# Patient Record
Sex: Female | Born: 1990 | Race: White | Hispanic: Yes | Marital: Married | Smoking: Never smoker
Health system: Southern US, Community
[De-identification: ages and names within clinical notes are randomized; demographics above are authoritative.]

## PROBLEM LIST (undated history)

## (undated) ENCOUNTER — Inpatient Hospital Stay (HOSPITAL_COMMUNITY): Payer: Self-pay

## (undated) DIAGNOSIS — R000 Tachycardia, unspecified: Secondary | ICD-10-CM

## (undated) DIAGNOSIS — K219 Gastro-esophageal reflux disease without esophagitis: Secondary | ICD-10-CM

## (undated) DIAGNOSIS — E221 Hyperprolactinemia: Secondary | ICD-10-CM

## (undated) DIAGNOSIS — D649 Anemia, unspecified: Secondary | ICD-10-CM

## (undated) DIAGNOSIS — R002 Palpitations: Secondary | ICD-10-CM

## (undated) HISTORY — DX: Palpitations: R00.2

## (undated) HISTORY — DX: Hyperprolactinemia: E22.1

## (undated) HISTORY — PX: NO PAST SURGERIES: SHX2092

## (undated) HISTORY — DX: Tachycardia, unspecified: R00.0

## (undated) HISTORY — DX: Gastro-esophageal reflux disease without esophagitis: K21.9

---
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
## 2010-02-20 ENCOUNTER — Observation Stay (HOSPITAL_COMMUNITY)
Admission: EM | Admit: 2010-02-20 | Discharge: 2010-02-20 | Payer: Self-pay | Source: Home / Self Care | Attending: Emergency Medicine | Admitting: Emergency Medicine

## 2010-02-22 ENCOUNTER — Emergency Department (HOSPITAL_COMMUNITY)
Admission: EM | Admit: 2010-02-22 | Discharge: 2010-02-22 | Payer: Self-pay | Source: Home / Self Care | Admitting: Family Medicine

## 2010-05-03 ENCOUNTER — Encounter: Payer: Self-pay | Admitting: Internal Medicine

## 2010-05-03 ENCOUNTER — Ambulatory Visit (INDEPENDENT_AMBULATORY_CARE_PROVIDER_SITE_OTHER): Payer: Managed Care, Other (non HMO) | Admitting: Internal Medicine

## 2010-05-03 DIAGNOSIS — J039 Acute tonsillitis, unspecified: Secondary | ICD-10-CM | POA: Insufficient documentation

## 2010-05-03 DIAGNOSIS — T7840XA Allergy, unspecified, initial encounter: Secondary | ICD-10-CM

## 2010-05-04 ENCOUNTER — Encounter: Payer: Self-pay | Admitting: Internal Medicine

## 2010-05-04 ENCOUNTER — Telehealth: Payer: Self-pay | Admitting: Internal Medicine

## 2010-05-10 NOTE — Letter (Signed)
Summary: Out of School  [facility]  [address] [place], [place] [address]   Phone: [phone]  Fax: [phone]    May 04, 2010   Student:  Lorin Picket    To Whom It May Concern:   For Medical reasons, please excuse the above named student from school for the following dates:  Start:   05/03/10 End:    05/04/10  The patient may return on 05/05/10 If you need additional information, please feel free to contact our office.   Sincerely,    Willow Ora, MD    ****This is a legal document and cannot be tampered with.  Schools are authorized to verify all information and to do so accordingly.

## 2010-05-10 NOTE — Assessment & Plan Note (Signed)
Summary: NEW TO EST,AETNA,NS FEE/RH.....   Vital Signs:  Patient profile:   20 year old female Height:      61.5 inches Weight:      104 pounds BMI:     19.40 Pulse rate:   71 / minute Pulse rhythm:   regular BP sitting:   116 / 64  (left arm) Cuff size:   regular  Vitals Entered By: Army Fossa CMA (May 03, 2010 2:02 PM) CC: Pt here to new est-fasting  Comments wants referrals to Gyn and Allergist   History of Present Illness: new patient, here w/ mom  needs gyn referal  ?allergist referal... concerned b/c was dx w/ allergy to vancomycin and doxy, wonders what else she is allergic to  on-off white patches @ tonsils that she can remove w/ her finger or "cough up" symptoms not necesarily associated to ST-F  ROS feels well today, no fever, no ST denies itchy eyes or nose no PN drip no cough or chest congestion       Preventive Screening-Counseling & Management  Alcohol-Tobacco     Smoking Status: never  Caffeine-Diet-Exercise     Does Patient Exercise: yes  Allergies (verified): 1)  ! Vancomycin 2)  ! Doxycycline Hyclate (Doxycycline Hyclate)  Past History:  Family History: Last updated: 05/03/2010 colon ca--no Breast cancer-- multiple from father's side  Diabetes-- GP  Hypertension-- GF   Social History: Last updated: 05/03/2010 Single Never Smoked Alcohol use-no Regular exercise-yes lives w/ parents   Risk Factors: Exercise: yes (05/03/2010)  Risk Factors: Smoking Status: never (05/03/2010)  Past Surgical History: no major   Family History: colon ca--no Breast cancer-- multiple from father's side  Diabetes-- GP  Hypertension-- GF   Social History: Single Never Smoked Alcohol use-no Regular exercise-yes lives w/ parents   Smoking Status:  never Does Patient Exercise:  yes  Physical Exam  General:  alert and well-developed.  NAD Ears:  R ear normal and L ear normal.   Nose:  no nasal discharge.   Mouth:  throat  w/o redness , no d/c, tonsils symetric-small Lungs:  normal respiratory effort, no intercostal retractions, no accessory muscle use, and normal breath sounds.   Heart:  normal rate, regular rhythm, and no murmur.     Impression & Recommendations:  Problem # 1:  ALLERGIC REACTION (ICD-995.3)  allegic rx: strongly recommend to memorize the name of the 2 abx she is allergic to i don't feel a formal allergist referal is indicated but if she is worried , she will call and I'll refer her  addendum----likes to see the specilist, referal done call  patient at [phone]  Orders: Allergy Referral  (Allergy)  Problem # 2:  ALLERGIC REACTION (ICD-995.3)  on -off  tonsilitis? checka Cx, treat if appropiate call if F-ST  Orders: Allergy Referral  (Allergy)  Problem # 3:  ROUTINE GENERAL MEDICAL EXAM@HEALTH  CARE FACL (ICD-V70.0)  refr to gyn, needs a femae practitioner   Orders: Gynecologic Referral (Gyn)  Other Orders: T-Culture, Throat ([id number])   Orders Added: 1)  T-Culture, Throat [[id number]] 2)  Gynecologic Referral [Gyn] 3)  Allergy Referral  [Allergy] 4)  New Patient Level II [57846]

## 2010-05-10 NOTE — Progress Notes (Signed)
Summary: Note for school  Phone Note Call from Patient Call back at Home Phone [phone]   Summary of Call: Patient left message on triage that she forgot to ask for note for school. She notes that she cannot pick it up today, but can do so tomorrow. Please advise. Lucious Groves CMA  May 04, 2010 11:56 AM   Follow-up for Phone Call        ok to do for yesterday and today if needed  Follow-up by: [facility] E. Paz MD,  May 04, 2010 12:49 PM  Additional Follow-up for Phone Call Additional follow up Details #1::        Left message on voicemail that note is ready for pick up.  Additional Follow-up by: Lucious Groves CMA,  May 04, 2010 2:19 PM

## 2010-05-24 LAB — POCT I-STAT, CHEM 8
BUN: 15 mg/dL (ref 6–23)
Chloride: 106 mEq/L (ref 96–112)
Creatinine, Ser: 0.7 mg/dL (ref 0.4–1.2)
Potassium: 3.8 mEq/L (ref 3.5–5.1)
Sodium: 141 mEq/L (ref 135–145)
TCO2: 26 mmol/L (ref 0–100)

## 2010-05-24 LAB — DIFFERENTIAL
Eosinophils Relative: 4 % (ref 0–5)
Lymphocytes Relative: 32 % (ref 12–46)
Lymphs Abs: 1.8 10*3/uL (ref 0.7–4.0)
Neutro Abs: 2.9 10*3/uL (ref 1.7–7.7)

## 2010-05-24 LAB — CBC
MCV: 89.1 fL (ref 78.0–100.0)
Platelets: 242 10*3/uL (ref 150–400)
RBC: 4.3 MIL/uL (ref 3.87–5.11)
WBC: 5.7 10*3/uL (ref 4.0–10.5)

## 2010-06-15 LAB — DIFFERENTIAL
Basophils Absolute: 0 10*3/uL (ref 0.0–0.1)
Basophils Relative: 1 % (ref 0–1)
Eosinophils Absolute: 0.2 10*3/uL (ref 0.0–1.2)
Eosinophils Relative: 3 % (ref 0–5)
Lymphocytes Relative: 38 % (ref 24–48)
Lymphs Abs: 2.4 10*3/uL (ref 1.1–4.8)
Monocytes Absolute: 0.6 10*3/uL (ref 0.2–1.2)
Monocytes Relative: 9 % (ref 3–11)
Neutro Abs: 3.2 10*3/uL (ref 1.7–8.0)
Neutrophils Relative %: 50 % (ref 43–71)

## 2010-06-15 LAB — POCT I-STAT, CHEM 8
BUN: 7 mg/dL (ref 6–23)
Calcium, Ion: 1.19 mmol/L (ref 1.12–1.32)
Chloride: 105 mEq/L (ref 96–112)
Creatinine, Ser: 0.8 mg/dL (ref 0.4–1.2)
Glucose, Bld: 88 mg/dL (ref 70–99)
HCT: 42 % (ref 36.0–49.0)
Hemoglobin: 14.3 g/dL (ref 12.0–16.0)
Potassium: 3.9 mEq/L (ref 3.5–5.1)
Sodium: 138 mEq/L (ref 135–145)
TCO2: 23 mmol/L (ref 0–100)

## 2010-06-15 LAB — POCT URINALYSIS DIP (DEVICE)
Bilirubin Urine: NEGATIVE
Glucose, UA: NEGATIVE mg/dL
Hgb urine dipstick: NEGATIVE
Ketones, ur: NEGATIVE mg/dL
Nitrite: NEGATIVE
Protein, ur: NEGATIVE mg/dL
Specific Gravity, Urine: 1.02 (ref 1.005–1.030)
Urobilinogen, UA: 0.2 mg/dL (ref 0.0–1.0)
pH: 6 (ref 5.0–8.0)

## 2010-06-15 LAB — CBC
HCT: 39.3 % (ref 36.0–49.0)
Hemoglobin: 13.4 g/dL (ref 12.0–16.0)
MCHC: 34.1 g/dL (ref 31.0–37.0)
MCV: 87.8 fL (ref 78.0–98.0)
Platelets: 240 10*3/uL (ref 150–400)
RBC: 4.48 MIL/uL (ref 3.80–5.70)
RDW: 13.5 % (ref 11.4–15.5)
WBC: 6.4 10*3/uL (ref 4.5–13.5)

## 2010-06-15 LAB — POCT H PYLORI SCREEN: H. PYLORI SCREEN, POC: POSITIVE — AB

## 2010-10-11 ENCOUNTER — Emergency Department (HOSPITAL_COMMUNITY)
Admission: EM | Admit: 2010-10-11 | Discharge: 2010-10-11 | Disposition: A | Discharge status: Home / Self Care | Payer: Managed Care, Other (non HMO) | Attending: Emergency Medicine | Admitting: Emergency Medicine

## 2010-10-11 DIAGNOSIS — I10 Essential (primary) hypertension: Secondary | ICD-10-CM | POA: Insufficient documentation

## 2010-10-11 DIAGNOSIS — F410 Panic disorder [episodic paroxysmal anxiety] without agoraphobia: Secondary | ICD-10-CM | POA: Insufficient documentation

## 2010-10-11 DIAGNOSIS — T7840XA Allergy, unspecified, initial encounter: Secondary | ICD-10-CM | POA: Insufficient documentation

## 2010-12-08 LAB — POCT PREGNANCY, URINE: Operator id: 26329

## 2010-12-08 LAB — DIFFERENTIAL
Basophils Absolute: 0
Basophils Relative: 1
Monocytes Absolute: 0.7
Neutro Abs: 6
Neutrophils Relative %: 68

## 2010-12-08 LAB — COMPREHENSIVE METABOLIC PANEL
AST: 21
Albumin: 4.1
Alkaline Phosphatase: 66
CO2: 21
Calcium: 9
Creatinine, Ser: 0.71
Sodium: 134 — ABNORMAL LOW
Total Bilirubin: 0.6
Total Protein: 7.2

## 2010-12-08 LAB — CBC
HCT: 35.4 — ABNORMAL LOW
Hemoglobin: 12.3
MCHC: 34.9
MCHC: 34
MCV: 88.3
MCV: 87.3
Platelets: 221
Platelets: 237
RBC: 4.33
RDW: 14.7
RDW: 14.4

## 2010-12-08 LAB — URINALYSIS, ROUTINE W REFLEX MICROSCOPIC
Glucose, UA: NEGATIVE
Hgb urine dipstick: NEGATIVE
Ketones, ur: NEGATIVE
Nitrite: NEGATIVE
Protein, ur: NEGATIVE
Protein, ur: NEGATIVE
Urobilinogen, UA: 1
pH: 6

## 2010-12-08 LAB — GC/CHLAMYDIA PROBE AMP, GENITAL: Chlamydia, DNA Probe: NEGATIVE

## 2010-12-08 LAB — URINE CULTURE
Colony Count: NO GROWTH
Culture: NO GROWTH

## 2010-12-08 LAB — URINE MICROSCOPIC-ADD ON

## 2010-12-08 LAB — WET PREP, GENITAL
Clue Cells Wet Prep HPF POC: NONE SEEN
Trich, Wet Prep: NONE SEEN
Yeast Wet Prep HPF POC: NONE SEEN

## 2011-02-07 ENCOUNTER — Emergency Department (HOSPITAL_COMMUNITY)
Admission: EM | Admit: 2011-02-07 | Discharge: 2011-02-08 | Disposition: A | Discharge status: Home / Self Care | Payer: Self-pay | Attending: Emergency Medicine | Admitting: Emergency Medicine

## 2011-02-07 ENCOUNTER — Encounter: Payer: Self-pay | Admitting: *Deleted

## 2011-02-07 ENCOUNTER — Emergency Department (HOSPITAL_COMMUNITY): Payer: Self-pay

## 2011-02-07 DIAGNOSIS — R059 Cough, unspecified: Secondary | ICD-10-CM | POA: Insufficient documentation

## 2011-02-07 DIAGNOSIS — R5383 Other fatigue: Secondary | ICD-10-CM | POA: Insufficient documentation

## 2011-02-07 DIAGNOSIS — J111 Influenza due to unidentified influenza virus with other respiratory manifestations: Secondary | ICD-10-CM | POA: Insufficient documentation

## 2011-02-07 DIAGNOSIS — R5381 Other malaise: Secondary | ICD-10-CM | POA: Insufficient documentation

## 2011-02-07 DIAGNOSIS — R509 Fever, unspecified: Secondary | ICD-10-CM | POA: Insufficient documentation

## 2011-02-07 DIAGNOSIS — R51 Headache: Secondary | ICD-10-CM | POA: Insufficient documentation

## 2011-02-07 DIAGNOSIS — IMO0001 Reserved for inherently not codable concepts without codable children: Secondary | ICD-10-CM | POA: Insufficient documentation

## 2011-02-07 DIAGNOSIS — J3489 Other specified disorders of nose and nasal sinuses: Secondary | ICD-10-CM | POA: Insufficient documentation

## 2011-02-07 DIAGNOSIS — R070 Pain in throat: Secondary | ICD-10-CM | POA: Insufficient documentation

## 2011-02-07 DIAGNOSIS — R05 Cough: Secondary | ICD-10-CM | POA: Insufficient documentation

## 2011-02-07 LAB — CBC
HCT: 37.7 % (ref 36.0–46.0)
Hemoglobin: 12.5 g/dL (ref 12.0–15.0)
MCH: 29.4 pg (ref 26.0–34.0)
MCHC: 33.2 g/dL (ref 30.0–36.0)
MCV: 88.7 fL (ref 78.0–100.0)

## 2011-02-07 LAB — BASIC METABOLIC PANEL
BUN: 9 mg/dL (ref 6–23)
GFR calc non Af Amer: >90 mL/min (ref >90)
Glucose, Bld: 101 mg/dL — ABNORMAL HIGH (ref 70–99)
Potassium: 4.3 mEq/L (ref 3.5–5.1)

## 2011-02-07 MED ORDER — ACETAMINOPHEN 325 MG PO TABS
650.0000 mg | ORAL_TABLET | Freq: Once | ORAL | Status: AC
  Administered 2011-02-07: 650 mg via ORAL
  Filled 2011-02-07: qty 2

## 2011-02-07 MED ORDER — SODIUM CHLORIDE 0.9 % IV BOLUS (SEPSIS)
1000.0000 mL | Freq: Once | INTRAVENOUS | Status: AC
  Administered 2011-02-07: 1000 mL via INTRAVENOUS

## 2011-02-07 NOTE — ED Notes (Signed)
Pt reports last dose of APAP at 1400 and ibuprofen was yesterday evening.

## 2011-02-07 NOTE — ED Notes (Signed)
Headache chills temp elevated  And pain over her entire body since yesterday

## 2011-02-08 MED ORDER — AZITHROMYCIN 250 MG PO TABS
500.0000 mg | ORAL_TABLET | Freq: Once | ORAL | Status: AC
  Administered 2011-02-08: 500 mg via ORAL
  Filled 2011-02-08: qty 2

## 2011-02-08 MED ORDER — OSELTAMIVIR PHOSPHATE 75 MG PO CAPS: 75.0000 mg | ORAL_CAPSULE | Freq: Two times a day (BID) | ORAL | Status: DC

## 2011-02-08 MED ORDER — AZITHROMYCIN 250 MG PO TABS: 250.0000 mg | ORAL_TABLET | Freq: Every day | ORAL | Status: AC

## 2011-02-08 MED ORDER — NAPROXEN 500 MG PO TABS: 500.0000 mg | ORAL_TABLET | Freq: Two times a day (BID) | ORAL | Status: DC

## 2011-02-08 MED ORDER — OSELTAMIVIR PHOSPHATE 75 MG PO CAPS: 75.0000 mg | ORAL_CAPSULE | Freq: Two times a day (BID) | ORAL | Status: AC

## 2011-02-08 NOTE — ED Provider Notes (Signed)
History     CSN: [id number] Arrival date & time: 02/07/2011  4:32 PM   First MD Initiated Contact with Patient 02/07/11 2106      Chief Complaint  Patient presents with  . Fever    (Consider location/radiation/quality/duration/timing/severity/associated sxs/prior treatment) Patient is a 20 y.o. female presenting with fever. The history is provided by the patient and a parent.  Fever Primary symptoms of the febrile illness include fever, fatigue, headaches, cough and myalgias. Primary symptoms do not include shortness of breath, abdominal pain, nausea, vomiting, diarrhea, dysuria, altered mental status or rash. The current episode started yesterday. This is a new problem. The problem has been gradually worsening.  The headache began yesterday. The pain from the headache is at a severity of 6/10. The headache is not associated with aura, photophobia, double vision, eye pain, stiff neck or loss of balance.  The cough began yesterday. The cough is non-productive.  Myalgias began yesterday. The myalgias have been gradually worsening since their onset. The myalgias are generalized. The myalgias are aching.   Patient also with complaint of sore throat, headache is mainly on the top of her head, no neck stiff, onset of fever yesterday highest temperature has been has been 102, currently temperature is 99.2 F. no sick contacts.  History reviewed. No pertinent past medical history.  History reviewed. No pertinent past surgical history.  History reviewed. No pertinent family history.  History  Substance Use Topics  . Smoking status: Never Smoker   . Smokeless tobacco: Not on file  . Alcohol Use: No    OB History    Grav Para Term Preterm Abortions TAB SAB Ect Mult Living                  Review of Systems  Constitutional: Positive for fever and fatigue.  HENT: Positive for congestion and sore throat.   Eyes: Negative for double vision, photophobia and pain.  Respiratory: Positive  for cough. Negative for shortness of breath.   Cardiovascular: Negative for chest pain.  Gastrointestinal: Negative for nausea, vomiting, abdominal pain and diarrhea.  Genitourinary: Negative for dysuria and hematuria.  Musculoskeletal: Positive for myalgias. Negative for joint swelling.  Skin: Negative for rash.  Neurological: Positive for headaches. Negative for seizures and loss of balance.  Hematological: Negative for adenopathy.  Psychiatric/Behavioral: Negative for confusion and altered mental status.    Allergies  Doxycycline hyclate; Penicillins; and Vancomycin  Home Medications   Current Outpatient Rx  Name Route Sig Dispense Refill  . DM-PHENYLEPHRINE-ACETAMINOPHEN 10-5-325 MG PO CAPS Oral Take 2 tablets by mouth every 6 (six) hours as needed. For cold symptoms     . IBUPROFEN 200 MG PO TABS Oral Take 200 mg by mouth every 6 (six) hours as needed. Pain and fever     . AZITHROMYCIN 250 MG PO TABS Oral Take 1 tablet (250 mg total) by mouth daily. 5 tablet 0  . EPINEPHRINE 0.3 MG/0.3ML IJ SOLN Injection Inject 0.3 mLs as directed once. For severe allergic reactions     . MEDROXYPROGESTERONE ACETATE 104 MG/0.65ML Duncan SUSP Subcutaneous Inject 104 mg into the skin every 3 (three) months.      Marland Kitchen NAPROXEN 500 MG PO TABS Oral Take 1 tablet (500 mg total) by mouth 2 (two) times daily. 14 tablet 0  . OSELTAMIVIR PHOSPHATE 75 MG PO CAPS Oral Take 1 capsule (75 mg total) by mouth every 12 (twelve) hours. 10 capsule 0    BP 102/67  Pulse 100  Temp(Src)  98.9 F (37.2 C) (Oral)  Resp 16  SpO2 97%  LMP 02/07/2011  Physical Exam  Nursing note and vitals reviewed. Constitutional: She appears well-developed and well-nourished. No distress.  HENT:  Head: Normocephalic and atraumatic.  Mouth/Throat: Oropharynx is clear and moist. No oropharyngeal exudate.  Eyes: Conjunctivae and EOM are normal. Pupils are equal, round, and reactive to light.  Neck: Normal range of motion. Neck supple.    Cardiovascular: Normal rate, regular rhythm and normal heart sounds.   No murmur heard. Pulmonary/Chest: Effort normal. No respiratory distress. She has no wheezes. She has no rales. She exhibits no tenderness.  Abdominal: Soft. Bowel sounds are normal. There is no tenderness.  Musculoskeletal: Normal range of motion. She exhibits no edema.  Lymphadenopathy:    She has no cervical adenopathy.  Neurological: She is alert. No cranial nerve deficit. She exhibits normal muscle tone. Coordination normal.  Skin: Skin is warm. No rash noted.    ED Course  Procedures (including critical care time)  Labs Reviewed  BASIC METABOLIC PANEL - Abnormal; Notable for the following:    Glucose, Bld 101 (*)    All other components within normal limits  CBC  URINALYSIS, ROUTINE W REFLEX MICROSCOPIC  RAPID STREP SCREEN   Dg Chest 2 View  02/07/2011  *RADIOLOGY REPORT*  Clinical Data: Fever.  Headache.  Cough.  CHEST - 2 VIEW  Comparison: None.  Findings: Cardiac and mediastinal contours appear normal.  The lungs appear clear.  No pleural effusion is identified.  IMPRESSION:  No significant abnormality identified.  Original Report Authenticated By: Dellia Cloud, M.D.   Results for orders placed during the hospital encounter of 02/07/11  CBC      Component Value Range   WBC 6.0  4.0 - 10.5 (K/uL)   RBC 4.25  3.87 - 5.11 (MIL/uL)   Hemoglobin 12.5  12.0 - 15.0 (g/dL)   HCT 40.9  81.1 - 91.4 (%)   MCV 88.7  78.0 - 100.0 (fL)   MCH 29.4  26.0 - 34.0 (pg)   MCHC 33.2  30.0 - 36.0 (g/dL)   RDW 78.2  95.6 - 21.3 (%)   Platelets 172  150 - 400 (K/uL)  BASIC METABOLIC PANEL      Component Value Range   Sodium 136  135 - 145 (mEq/L)   Potassium 4.3  3.5 - 5.1 (mEq/L)   Chloride 103  96 - 112 (mEq/L)   CO2 22  19 - 32 (mEq/L)   Glucose, Bld 101 (*) 70 - 99 (mg/dL)   BUN 9  6 - 23 (mg/dL)   Creatinine, Ser 0.86  0.50 - 1.10 (mg/dL)   Calcium 9.1  8.4 - 57.8 (mg/dL)   GFR calc non Af Amer >90   >90 (mL/min)   GFR calc Af Amer >90  >90 (mL/min)     1. Influenza       MDM   Clinically suspect patient has influenza symptoms just started yesterday so she's within 48 hours from the onset of the symptoms. Patient's throat not specific for strep however she has history of strep in the past so will treat with antibiotics. Rapid strep was ordered but was never a completed in the emergency department. Chest x-ray is negative for pneumonia. Clinically not consistent with meningitis. Will start patient on Tamiflu Naprosyn and Zithromax, she feels better after her IV hydration in the emergency dep. It is possible the patient may have infectious mononucleosis but her symptoms just starting yesterday unlikely  that this would be positive at this point in time was there is no specific treatment patient is to return if not improving in one week and at that time mono test could be done.        Shelda Jakes, MD 02/08/11 [facility] Manners

## 2011-03-31 ENCOUNTER — Encounter: Payer: Self-pay | Admitting: Internal Medicine

## 2011-03-31 ENCOUNTER — Ambulatory Visit (INDEPENDENT_AMBULATORY_CARE_PROVIDER_SITE_OTHER): Payer: Self-pay | Admitting: Internal Medicine

## 2011-03-31 DIAGNOSIS — Z0000 Encounter for general adult medical examination without abnormal findings: Secondary | ICD-10-CM | POA: Insufficient documentation

## 2011-03-31 NOTE — Progress Notes (Signed)
  Subjective:    Patient ID: Michele Price, female    DOB: 1990/09/06, 21 y.o.   MRN: [id number]  HPI Here a complete physical exam  Past medical history H/o Ovarian cysts   Past Surgical History: None   Social History: Attends [employer], lives w/ parents , leaving to [place] next week  Single, G0P0 Never Smoked Alcohol use-no Regular exercise-- no Diet-- regular   Family History: colon ca--no Breast cancer-- multiple from father's side  Diabetes-- GP Hypertension-- GF  Urolithiasis-- F   Review of Systems  Constitutional: Negative for fever and fatigue.  Respiratory: Negative for cough and wheezing.   Cardiovascular: Negative for chest pain and leg swelling.  Gastrointestinal: Negative for abdominal pain and blood in stool.  Genitourinary:       Irregular periods, plans to see gyn  Psychiatric/Behavioral:       No depression, occ anxiety episodes        Objective:   Physical Exam  Constitutional: She is oriented to person, place, and time. She appears well-developed and well-nourished.  HENT:  Head: Normocephalic and atraumatic.  Neck: No thyromegaly present.  Cardiovascular: Normal rate, regular rhythm and normal heart sounds.   No murmur heard. Pulmonary/Chest: Effort normal and breath sounds normal. No respiratory distress. She has no wheezes. She has no rales.  Abdominal: Soft. Bowel sounds are normal. She exhibits no distension. There is no tenderness. There is no rebound and no guarding.  Musculoskeletal: She exhibits no edema.  Neurological: She is alert and oriented to person, place, and time.  Psychiatric: She has a normal mood and affect. Her behavior is normal. Thought content normal.      Assessment & Plan:

## 2011-03-31 NOTE — Patient Instructions (Signed)
Use a low dose of benadryl OTC to help with anxiety during airplane trips

## 2011-03-31 NOTE — Assessment & Plan Note (Addendum)
Recommend to bring [place] all her shot records Last gyn visit 12-2010 per pt  Counseled: diet-exercise, safe driving, safe sex, risk of tobacco-alcohol-drug abuse Also get anxious in airplanes, counseled, recommend low dose of benadryl

## 2012-03-11 ENCOUNTER — Encounter (HOSPITAL_COMMUNITY): Payer: Self-pay

## 2012-03-11 ENCOUNTER — Emergency Department (HOSPITAL_COMMUNITY)
Admission: EM | Admit: 2012-03-11 | Discharge: 2012-03-11 | Disposition: A | Discharge status: Home / Self Care | Payer: Managed Care, Other (non HMO) | Source: Home / Self Care | Attending: Emergency Medicine | Admitting: Emergency Medicine

## 2012-03-11 ENCOUNTER — Emergency Department (INDEPENDENT_AMBULATORY_CARE_PROVIDER_SITE_OTHER): Payer: Managed Care, Other (non HMO)

## 2012-03-11 ENCOUNTER — Telehealth: Payer: Self-pay | Admitting: Internal Medicine

## 2012-03-11 DIAGNOSIS — R059 Cough, unspecified: Secondary | ICD-10-CM

## 2012-03-11 DIAGNOSIS — M9390 Osteochondropathy, unspecified of unspecified site: Secondary | ICD-10-CM

## 2012-03-11 DIAGNOSIS — R05 Cough: Secondary | ICD-10-CM

## 2012-03-11 LAB — POCT I-STAT, CHEM 8
Creatinine, Ser: 0.7 mg/dL (ref 0.50–1.10)
Glucose, Bld: 95 mg/dL (ref 70–99)
Hemoglobin: 14.6 g/dL (ref 12.0–15.0)
Potassium: 3.7 mEq/L (ref 3.5–5.1)

## 2012-03-11 MED ORDER — GUAIFENESIN-CODEINE 100-10 MG/5ML PO SYRP: 5.0000 mL | ORAL_SOLUTION | Freq: Three times a day (TID) | ORAL | Status: DC | PRN

## 2012-03-11 NOTE — Telephone Encounter (Signed)
Patient Information:  Caller Name: Eriona  Phone: [phone]  Patient: Michele Price, Michele Price  Gender: Female  DOB: 05/03/1990  Age: 21 Years  PCP: Willow Ora  Pregnant: No  Office Follow Up:  Does the office need to follow up with this patient?: No  Instructions For The Office: N/A  RN Note:  RN advised to hang up and dial 911  For worsening chest pain and trouble breathing, and she agreed. Pt stated her mother is with her.  Symptoms  Reason For Call & Symptoms: Pt calling wanting appt for trouble breathing and chest pain . Symptoms started ~ 03/04/2012, but feels worse since 03/10/2012. Hurts to breathe at times.  Has only had  cough since 03/10/2012.  Reviewed Health History In EMR: Yes  Reviewed Medications In EMR: Yes  Reviewed Allergies In EMR: Yes  Reviewed Surgeries / Procedures: Yes  Date of Onset of Symptoms: 03/04/2012  Treatments Tried: Tylenol  Treatments Tried Worked: No OB / GYN:  LMP: 02/29/2012  Guideline(s) Used:  Chest Pain  Disposition Per Guideline:   Call EMS 911 Now  Reason For Disposition Reached:   Chest pain lasting longer than 5 minutes and ANY of the following:  Over 9 years old Over 39 years old and at least one cardiac risk factor (i.e., high blood pressure, diabetes, high cholesterol, obesity, smoker or strong family history of heart disease) Pain is crushing, pressure-like, or heavy  Took nitroglycerin and chest pain was not relieved History of heart disease (i.e., angina, heart attack, bypass surgery, angioplasty, CHF)  Advice Given:  N/A

## 2012-03-11 NOTE — ED Provider Notes (Signed)
History     CSN: [id number]  Arrival date & time 03/11/12  1050   First MD Initiated Contact with Patient 03/11/12 1313      Chief Complaint  Patient presents with  . Muscle Pain    (Consider location/radiation/quality/duration/timing/severity/associated sxs/prior treatment) HPI Comments: Patient presents urgent care this afternoon brought in by her mother described that since yesterday she's been expressing left anterior chest pain is exacerbated when she takes a deep breath or when she coughs. Keep however does express that she has had this pain intermittently since yesterday, she denies any injuries or falls or trauma. But after coughing episodes have tasted some blood in her mouth but have not seen any blood and denies seen any phlegm or color sputum with her cough. She denies any shortness of breath, dizziness or headaches. Patient denies any recent surgeries, denies any recent long trips, any previous history of any thromboembolic disorder or coagulopathy disorder. She is a nonsmoker.  Patient is a 21 y.o. female presenting with musculoskeletal pain. The history is provided by the patient and a parent.  Muscle Pain This is a new problem. The current episode started yesterday. The problem occurs hourly. Associated symptoms include chest pain. Pertinent negatives include no headaches and no shortness of breath. The symptoms are aggravated by coughing (Inhalation). She has tried nothing for the symptoms.    History reviewed. No pertinent past medical history.  History reviewed. No pertinent past surgical history.  History reviewed. No pertinent family history.  History  Substance Use Topics  . Smoking status: Never Smoker   . Smokeless tobacco: Not on file  . Alcohol Use: No    OB History    Grav Para Term Preterm Abortions TAB SAB Ect Mult Living                  Review of Systems  Constitutional: Positive for activity change. Negative for chills, diaphoresis, appetite  change, fatigue and unexpected weight change.  HENT: Negative for neck pain, neck stiffness and sinus pressure.   Respiratory: Positive for cough. Negative for shortness of breath, wheezing and stridor.   Cardiovascular: Positive for chest pain and palpitations. Negative for leg swelling.  Skin: Negative for color change, pallor and rash.  Neurological: Negative for dizziness, seizures and headaches.    Allergies  Doxycycline hyclate; Penicillins; and Vancomycin  Home Medications   Current Outpatient Rx  Name  Route  Sig  Dispense  Refill  . GUAIFENESIN-CODEINE 100-10 MG/5ML PO SYRP   Oral   Take 5 mLs by mouth 3 (three) times daily as needed for cough or congestion.   120 mL   0     BP 126/74  Pulse 140  Temp 98.8 F (37.1 C) (Oral)  Resp 18  SpO2 99%  LMP 02/29/2012  Physical Exam  Nursing note and vitals reviewed. Constitutional: She appears well-developed and well-nourished.  HENT:  Head: Normocephalic.  Mouth/Throat: No oropharyngeal exudate.  Eyes: Conjunctivae normal are normal. Pupils are equal, round, and reactive to light. No scleral icterus.  Neck: Neck supple. No JVD present.  Cardiovascular: Regular rhythm and normal heart sounds.  Exam reveals no gallop and no friction rub.   No murmur heard. Pulmonary/Chest: Effort normal and breath sounds normal. No respiratory distress. She has no wheezes. She has no rales. Chest wall is not dull to percussion. She exhibits tenderness. She exhibits no mass.    Abdominal: Soft.  Skin: No rash noted. No erythema.    ED Course  Procedures (including critical care time)   Labs Reviewed  D-DIMER, QUANTITATIVE  POCT I-STAT, CHEM 8   Dg Chest 2 View  03/11/2012  *RADIOLOGY REPORT*  Clinical Data: Pt complains of mid chest discomfort for 2 weeks; yesterday started having pain; no injury; non smoker; no other chest complaints  CHEST - 2 VIEW  Comparison: 02/07/2011  Findings: The lungs appear clear.  No significant  abnormality of the heart or mediastinum identified.  No pleural effusion, pneumothorax, or findings of pneumonia.  IMPRESSION:  No significant abnormality identified.   Original Report Authenticated By: Gaylyn Rong, M.D.      1. Osteochondritis   2. Cough   3. Sinus Tachycardia  EKG performed  sinus tachycardia to 121 beats per minute. No ST or T-wave abnormalities to suggest ischemia. Negative d-dimer 12 electrolytes within normal  MDM  The patient with coexistent cough and, generalized body aches and noted to be in sinus tachycardia throughout her encounter today. At times felt to be related to anxiety as patient, pulse 1 improve few minutes later. Patient had a unremarkable cardiovascular and lung exam. We have perform a d-dimer under low suspicion for a thromboembolic event as patient was describing left anterior chest pain, with no discernible risk factor for thromboembolic disorder. This patient is coughing she was prescribed a syrup of contain coating with the same for both antitussive person and pain management as patient is having generalized body aches. Encourage mother to monitor temperatures today and to provide means for Emmalou to hydrate herself well.     Jimmie Molly, MD 03/11/12 [phone]

## 2012-03-11 NOTE — ED Notes (Signed)
C/o pain in left anterior chest since yesterday, episodic in nature , come and goes, no causative factors, no strain or injury history. States she can "taste blood", but none seen , no tenderness on palpation of chest or inspiration or ROM of upper extremity ; NAD

## 2012-03-12 NOTE — Telephone Encounter (Signed)
S/p eval @ the [facility]

## 2012-03-14 ENCOUNTER — Encounter (HOSPITAL_COMMUNITY): Payer: Self-pay | Admitting: Emergency Medicine

## 2012-03-14 ENCOUNTER — Emergency Department (HOSPITAL_COMMUNITY): Payer: Managed Care, Other (non HMO)

## 2012-03-14 ENCOUNTER — Emergency Department (HOSPITAL_COMMUNITY)
Admission: EM | Admit: 2012-03-14 | Discharge: 2012-03-14 | Disposition: A | Discharge status: Home / Self Care | Payer: Managed Care, Other (non HMO) | Attending: Emergency Medicine | Admitting: Emergency Medicine

## 2012-03-14 DIAGNOSIS — J209 Acute bronchitis, unspecified: Secondary | ICD-10-CM | POA: Insufficient documentation

## 2012-03-14 DIAGNOSIS — R091 Pleurisy: Secondary | ICD-10-CM

## 2012-03-14 DIAGNOSIS — R51 Headache: Secondary | ICD-10-CM | POA: Insufficient documentation

## 2012-03-14 DIAGNOSIS — R509 Fever, unspecified: Secondary | ICD-10-CM | POA: Insufficient documentation

## 2012-03-14 DIAGNOSIS — J40 Bronchitis, not specified as acute or chronic: Secondary | ICD-10-CM

## 2012-03-14 DIAGNOSIS — Z3202 Encounter for pregnancy test, result negative: Secondary | ICD-10-CM | POA: Insufficient documentation

## 2012-03-14 DIAGNOSIS — N926 Irregular menstruation, unspecified: Secondary | ICD-10-CM | POA: Insufficient documentation

## 2012-03-14 DIAGNOSIS — R5381 Other malaise: Secondary | ICD-10-CM | POA: Insufficient documentation

## 2012-03-14 DIAGNOSIS — R5383 Other fatigue: Secondary | ICD-10-CM | POA: Insufficient documentation

## 2012-03-14 LAB — CBC WITH DIFFERENTIAL/PLATELET
Basophils Absolute: 0 10*3/uL (ref 0.0–0.1)
Lymphocytes Relative: 27 % (ref 12–46)
Neutro Abs: 2.2 10*3/uL (ref 1.7–7.7)
Platelets: 175 10*3/uL (ref 150–400)
RDW: 13.2 % (ref 11.5–15.5)
WBC: 4 10*3/uL (ref 4.0–10.5)

## 2012-03-14 LAB — COMPREHENSIVE METABOLIC PANEL
ALT: 8 U/L (ref 0–35)
AST: 16 U/L (ref 0–37)
CO2: 21 mEq/L (ref 19–32)
Chloride: 100 mEq/L (ref 96–112)
GFR calc non Af Amer: >90 mL/min (ref >90)
Sodium: 135 mEq/L (ref 135–145)
Total Bilirubin: 0.2 mg/dL — ABNORMAL LOW (ref 0.3–1.2)

## 2012-03-14 LAB — URINALYSIS, ROUTINE W REFLEX MICROSCOPIC
Bilirubin Urine: NEGATIVE
Hgb urine dipstick: NEGATIVE
Protein, ur: NEGATIVE mg/dL
Specific Gravity, Urine: 1.021 (ref 1.005–1.030)
Urobilinogen, UA: 1 mg/dL (ref 0.0–1.0)

## 2012-03-14 LAB — TSH: TSH: 1.092 u[IU]/mL (ref 0.350–4.500)

## 2012-03-14 LAB — TROPONIN I: Troponin I: <0.3 ng/mL (ref <0.30)

## 2012-03-14 LAB — MONONUCLEOSIS SCREEN: Mono Screen: NEGATIVE

## 2012-03-14 MED ORDER — SODIUM CHLORIDE 0.9 % IV SOLN
INTRAVENOUS | Status: DC
  Administered 2012-03-14: 16:00:00 via INTRAVENOUS

## 2012-03-14 MED ORDER — PREDNISONE 20 MG PO TABS: ORAL_TABLET | ORAL | Status: DC

## 2012-03-14 MED ORDER — ALBUTEROL SULFATE HFA 108 (90 BASE) MCG/ACT IN AERS: 2.0000 | INHALATION_SPRAY | RESPIRATORY_TRACT | Status: DC | PRN

## 2012-03-14 MED ORDER — AZITHROMYCIN 250 MG PO TABS: ORAL_TABLET | ORAL | Status: DC

## 2012-03-14 NOTE — ED Notes (Signed)
Pt states that she feels tired, chest is tight, and the upper portion of her back feels tight also.

## 2012-03-14 NOTE — ED Notes (Signed)
Pt is also having abnormal vaginal bleeding. Had regular period from 12/19 to 12/21 and has now started bleeding heavily again.

## 2012-03-14 NOTE — ED Notes (Signed)
Family at bedside. mother 

## 2012-03-14 NOTE — ED Provider Notes (Signed)
History     CSN: [id number]  Arrival date & time 03/14/12  1313   First MD Initiated Contact with Patient 03/14/12 1540      Chief Complaint  Patient presents with  . Palpitations  . Shortness of Breath    (Consider location/radiation/quality/duration/timing/severity/associated sxs/prior treatment) HPI Comments: Patient comes to the ER with multiple complaints. She reports that she has been sick for a couple of weeks. She says that starting around December 20 she started to experience heart palpitations and a burning sensation in the left side of her chest. She then developed a cough which is mostly nonproductive. She reports that she has gotten progressively more short of breath. She feels like her heart races at times and she can't take a deep breath. Mother reports that she has seemed very weak and tired.  Patient also reports that she has had irregular menstrual bleeding. She had her normal period in the middle of December and now has started bleeding again.   History reviewed. No pertinent past medical history.  History reviewed. No pertinent past surgical history.  History reviewed. No pertinent family history.  History  Substance Use Topics  . Smoking status: Never Smoker   . Smokeless tobacco: Not on file  . Alcohol Use: No    OB History    Grav Para Term Preterm Abortions TAB SAB Ect Mult Living                  Review of Systems  Constitutional: Positive for fever and fatigue.  Respiratory: Positive for cough, chest tightness and shortness of breath.   Cardiovascular: Positive for chest pain.  Genitourinary: Positive for menstrual problem.  Neurological: Positive for headaches.  All other systems reviewed and are negative.    Allergies  Doxycycline hyclate; Penicillins; and Vancomycin  Home Medications   Current Outpatient Rx  Name  Route  Sig  Dispense  Refill  . ACETAMINOPHEN 325 MG PO TABS   Oral   Take 650 mg by mouth every 6 (six) hours as  needed. For fever         . GUAIFENESIN-CODEINE 100-10 MG/5ML PO SYRP   Oral   Take 5 mLs by mouth 3 (three) times daily as needed for cough or congestion.   120 mL   0     BP 117/75  Pulse 106  Temp 98.7 F (37.1 C) (Oral)  Resp 18  SpO2 96%  LMP 03/11/2012  Physical Exam  Constitutional: She is oriented to person, place, and time. She appears well-developed and well-nourished. No distress.  HENT:  Head: Normocephalic and atraumatic.  Right Ear: Hearing normal.  Nose: Nose normal.  Mouth/Throat: Oropharynx is clear and moist and mucous membranes are normal.  Eyes: Conjunctivae normal and EOM are normal. Pupils are equal, round, and reactive to light.  Neck: Normal range of motion. Neck supple.  Cardiovascular: Normal rate, regular rhythm, S1 normal and S2 normal.  Exam reveals no gallop and no friction rub.   No murmur heard. Pulmonary/Chest: Effort normal and breath sounds normal. No respiratory distress. She exhibits no tenderness.  Abdominal: Soft. Normal appearance and bowel sounds are normal. There is no hepatosplenomegaly. There is no tenderness. There is no rebound, no guarding, no tenderness at McBurney's point and negative Murphy's sign. No hernia.  Musculoskeletal: Normal range of motion.  Neurological: She is alert and oriented to person, place, and time. She has normal strength. No cranial nerve deficit or sensory deficit. Coordination normal. GCS eye subscore  is 4. GCS verbal subscore is 5. GCS motor subscore is 6.  Skin: Skin is warm, dry and intact. No rash noted. No cyanosis.  Psychiatric: She has a normal mood and affect. Her speech is normal and behavior is normal. Thought content normal.    ED Course  Procedures (including critical care time)  Labs Reviewed  CBC WITH DIFFERENTIAL - Abnormal; Notable for the following:    Monocytes Relative 16 (*)     All other components within normal limits  COMPREHENSIVE METABOLIC PANEL - Abnormal; Notable for the  following:    Total Bilirubin 0.2 (*)     All other components within normal limits  URINALYSIS, ROUTINE W REFLEX MICROSCOPIC - Abnormal; Notable for the following:    Ketones, ur >80 (*)     All other components within normal limits  PREGNANCY, URINE  TROPONIN I  D-DIMER, QUANTITATIVE  MONONUCLEOSIS SCREEN  TSH   Dg Chest 2 View  03/14/2012  *RADIOLOGY REPORT*  Clinical Data: Shortness of breath  CHEST - 2 VIEW  Comparison: 03/11/2012  Findings: Lungs are clear. No pleural effusion or pneumothorax.  Cardiomediastinal silhouette is within normal limits.  Visualized osseous structures are within normal limits.  IMPRESSION: No evidence of acute cardiopulmonary disease.   Original Report Authenticated By: Charline Bills, M.D.      1. Bronchitis   2. Pleurisy       MDM  Patient presents to the ER for evaluation of shortness of breath. Patient has been sick for more than a week. She has been complaining of intermittent heart palpitations as well as shortness of breath. She has had a cough. A thorough workup has been performed. She has no evidence of cardiac disease, no cardiac risk factors. Her d-dimer was negative, which is enough to rule out PE because she is low risk at this time. Chest x-ray was clear, no signs of pneumonia.  Patient has symptoms are consistent with bronchitis with persistent bronchospasm and possible pleurisy. She will be treated with antibiotic coverage, prednisone and bronchodilators.        Gilda Crease, MD 03/14/12 [phone]

## 2012-03-14 NOTE — ED Notes (Signed)
Family at bedside. 

## 2012-03-14 NOTE — ED Notes (Signed)
Pt has had intermittent episodes of palpatations and chest "burning" since Dec 20th. Was evaluated at an Urgent Care where pt reports they did a D-dimer to r/o PE. Denies cp at present.

## 2012-03-14 NOTE — ED Notes (Signed)
Pt reports feeling fast heartbeat, SOB, lightheaded and dizzy. States history of same. Seen at urgent care Dec 30 with similar sx. Pt with anxious appearance. Denies chest pain. VSS. NAD.

## 2012-03-20 ENCOUNTER — Ambulatory Visit (INDEPENDENT_AMBULATORY_CARE_PROVIDER_SITE_OTHER): Payer: Managed Care, Other (non HMO) | Admitting: Internal Medicine

## 2012-03-20 ENCOUNTER — Encounter: Payer: Self-pay | Admitting: Internal Medicine

## 2012-03-20 VITALS — BP 122/76 | HR 90 | Temp 98.2°F | Wt 105.0 lb

## 2012-03-20 DIAGNOSIS — R002 Palpitations: Secondary | ICD-10-CM | POA: Insufficient documentation

## 2012-03-20 NOTE — Progress Notes (Signed)
  Subjective:    Patient ID: Michele Price, female    DOB: Jul 12, 1990, 22 y.o.   MRN: [id number]  HPI Followup. Was seen at the urgent care 03/11/2012, had cough, body aches, sinus tachycardia.-dimer was negative,EKG showed sinus tachycardia, was prescribed cough medication.  Was seen at the ER at 03/14/2012 with multiple symptoms going on for a week or 2: Chest pain, shortness of breath, dry cough, week, palpitations, weakness. Chest x-ray was negative, d-dimer negative. LFTs, electrolytes, CBC, pregnancy test negative Patient was diagnosed with bronchitis and possibly pleurisy. Was prescribed a Z-Pak, Deltasone and albuterol  Here for followup.  Past medical history H/o Ovarian cysts   Past Surgical History: None   Social History: Attends [employer], lives w/ parents  Single, G0P0 Never Smoked Alcohol use-no Regular exercise-- no Diet-- regular   Family History: colon ca--no Breast cancer-- multiple from father's side   Diabetes-- GP Hypertension-- GF   Urolithiasis-- F   Review of Systems After the patient left the ER, she started Zithromax and prednisone. Cough is better. Denies fevers. She continue with palpitations. On further questioning, palpitations started actually in June 2013, they last few minutes, sudden onset and gradual decrease of symptoms.  Palpitations are slightly more noticeable lately and associated with anterior burning chest pain. She also gets slightly dizzy and weak with palpitations. No syncope, no increasing pain or palpitation when she leans forward No airplane trips since June 2013. Denies anxiety.     Objective:   Physical Exam General -- alert, well-developed, and well-nourished.   Neck --no thyromegaly Lungs -- normal respiratory effort, no intercostal retractions, no accessory muscle use, and few rhonchi with cough Heart-- normal rate, regular rhythm, no murmur, and no gallop. Slightly tender to palpation in the left costochondral  area  Abdomen--soft, non-tender, no distention, no masses, no HSM, no guarding, and no rigidity.   Extremities-- no pretibial edema bilaterally Psych-- Cognition and judgment appear intact. Alert and cooperative with normal attention span and concentration.  slt anxious appearing but  not depressed appearing.      Assessment & Plan:   Bronchitis, Recently diagnosed with bronchitis, status post a Z-Pak, finishing prednisone. She still has a few rhonchi on exam the cough is better. Plan is observation

## 2012-03-20 NOTE — Patient Instructions (Addendum)
Return in 2 or 3 weeks if needed

## 2012-03-20 NOTE — Assessment & Plan Note (Signed)
  Persistent palpitations since June. Recent workup included a normal chest x-ray, negative d-dimer x2, no anemia, normal thyroid function. She denies anxiety. At some point the clinician at the ER for pleurisy, that is certainly possible, also mild pericarditis is in the ddx. EKG today sinus rhythm, no acute changes. Plan: Holter monitor, echocardiogram. ER if symptoms severe. Addendum-- prefers a cards referral, will arrange

## 2012-03-28 ENCOUNTER — Encounter: Payer: Self-pay | Admitting: Cardiovascular Disease

## 2012-03-28 ENCOUNTER — Ambulatory Visit (INDEPENDENT_AMBULATORY_CARE_PROVIDER_SITE_OTHER): Payer: Managed Care, Other (non HMO)

## 2012-03-28 ENCOUNTER — Ambulatory Visit (INDEPENDENT_AMBULATORY_CARE_PROVIDER_SITE_OTHER): Payer: Managed Care, Other (non HMO) | Admitting: Cardiovascular Disease

## 2012-03-28 VITALS — BP 104/78 | HR 104 | Ht 61.0 in | Wt 103.0 lb

## 2012-03-28 DIAGNOSIS — R002 Palpitations: Secondary | ICD-10-CM

## 2012-03-28 DIAGNOSIS — R000 Tachycardia, unspecified: Secondary | ICD-10-CM

## 2012-03-28 DIAGNOSIS — I498 Other specified cardiac arrhythmias: Secondary | ICD-10-CM

## 2012-03-28 NOTE — Assessment & Plan Note (Addendum)
Michele (Ciomi)  is a 22 year old female who presents today for palpitations. She has frequent episodes of tachycardia. She typically finds her heart rate in the 120 to 1:30 range but it has been as high as 140. She's been to the ER in urgent care in pairs occasions and they have recorded a sinus tachycardia in the 120 range. These episodes cause her to be short of breath and also lightheaded.  We discussed the possibilities that this might be supraventricular tachycardia. We discussed the 3 methods for terminating SVT. We'll place a 30 day event monitor on her.  I asked her to eat more potassium containing foods.  She has an elevated prolactin level. She will be seeing the endocrinologist in a week or so for further evaluation of this. There are some reports of elevated heart rate in the setting of elevated prolactin levels. Of note is that her TSH is normal. She had a negative pregnancy test.  I see her back in 6 weeks.

## 2012-03-28 NOTE — Patient Instructions (Addendum)
Your physician has recommended that you wear an event monitor//rose said if she can wait till she can place monitor today. Event monitors are medical devices that record the heart's electrical activity. Doctors most often [place] these monitors to diagnose arrhythmias. Arrhythmias are problems with the speed or rhythm of the heartbeat. The monitor is a small, portable device. You can wear one while you do your normal daily activities. This is usually used to diagnose what is causing palpitations/syncope (passing out).  Your physician recommends that you schedule a follow-up appointment in: 4-6 weeks

## 2012-03-28 NOTE — Addendum Note (Signed)
Addended by: Yolonda Kida on: 03/28/2012 05:59 PM   Modules accepted: Orders

## 2012-03-28 NOTE — Progress Notes (Signed)
    Michele Price Date of Birth  [birth year]       [facility]    [place] [address]  [address] [place], [place]  [address]   [place], [place]  [address] [phone]     [phone]   Fax  [phone]    Fax [phone]  Problem List: 1. Palpitations 2. Tachycardia  History of Present Illness:  Michele Price ( goes by Michele Price)  presents for further evaluation of palpitations. She describes a sudden increase in heart rate associated with with numbness and shortness of breath. These episodes typically last 5 minutes. They're not associated with eating, drinking, exercise, or change of position.  These started about a month ago when she presented with a respiratory illness thought to be bronchitis.  She has been avoiding caffeine.   The palpitations seem to be a regular tachycardia that last for several minutes. She usually sits down and rests and takes deep breaths.  She thinks that the palpitations gradually improve.  She has been to urgent care and also to her medical doctor's office. She is on a sinus tachycardia during those visits.  She's had very thorough workup. She has been found to have an elevated prolactin level. She's noted that her periods are somewhat irregular.  Her TSH level was normal.  Current Outpatient Prescriptions on File Prior to Visit  Medication Sig Dispense Refill  . acetaminophen (TYLENOL) 325 MG tablet Take 650 mg by mouth every 6 (six) hours as needed. For fever      . azithromycin (ZITHROMAX Z-PAK) 250 MG tablet 2 po day one, then 1 daily x 4 days  6 tablet  0  . predniSONE (DELTASONE) 20 MG tablet 3 tabs po daily x 3 days, then 2 tabs x 3 days, then 1.5 tabs x 3 days, then 1 tab x 3 days, then 0.5 tabs x 3 days  27 tablet  0    Allergies  Allergen Reactions  . Doxycycline Hyclate     REACTION: nausea , dizzines  . Penicillins Hives  . 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  No past surgical history on file.  History  Smoking status  . Never Smoker   Smokeless tobacco  . Not on file    History  Alcohol Use No    No family history on file.  Reviw of Systems:  Reviewed in the HPI.  All other systems are negative.  Physical Exam: Blood pressure 104/78, pulse 104, height 5\' 1"  (1.549 m), weight 103 lb (46.72 kg), last menstrual period 03/11/2012. General: Well developed, well nourished, in no acute distress.  Head: Normocephalic, atraumatic, sclera non-icteric, mucus membranes are moist,   Neck: Supple. Carotids are 2 + without bruits. No JVD   Lungs: Clear   Heart: RR, no significant click or murmur  Abdomen: Soft, non-tender, non-distended with normal bowel sounds. I was able to palpate her abdomina aorta, no bruits  Msk:  Strength and tone are normal   Extremities: No clubbing or cyanosis. No edema.  Distal pedal pulses are 2+ and equal    Neuro: CN II - XII intact.  Alert and oriented X 3.   Psych:  Normal   ECG: She's had several EKGs in the past. These have  shown sinus tachycardia in the 120 range.  Assessment / Plan:

## 2012-03-28 NOTE — Progress Notes (Signed)
Placed a 30 day event monitor on patient and went over instructions on how to use it and when to return it 

## 2012-04-27 ENCOUNTER — Other Ambulatory Visit: Payer: Self-pay

## 2012-05-06 ENCOUNTER — Ambulatory Visit (INDEPENDENT_AMBULATORY_CARE_PROVIDER_SITE_OTHER): Payer: Managed Care, Other (non HMO) | Admitting: Cardiovascular Disease

## 2012-05-06 ENCOUNTER — Encounter: Payer: Self-pay | Admitting: *Deleted

## 2012-05-06 VITALS — BP 105/71 | HR 71 | Ht 61.0 in | Wt 106.4 lb

## 2012-05-06 DIAGNOSIS — R000 Tachycardia, unspecified: Secondary | ICD-10-CM

## 2012-05-06 MED ORDER — PROPRANOLOL HCL 10 MG PO TABS: 10.0000 mg | ORAL_TABLET | Freq: Four times a day (QID) | ORAL | Status: DC | PRN

## 2012-05-06 NOTE — Progress Notes (Signed)
    Lorin Picket Date of Birth  [birth year]       [facility]    [place] [address]  [address] [place], [place]  [address]   [place], [place]  [address] [phone]     [phone]   Fax  [phone]    Fax [phone]  Problem List: 1. Palpitations 2. Tachycardia  History of Present Illness:  Denys ( goes by [facility])  presents for further evaluation of palpitations. She describes a sudden increase in heart rate associated with with numbness and shortness of breath. These episodes typically last 5 minutes. They're not associated with eating, drinking, exercise, or change of position.  These started about a month ago when she presented with a respiratory illness thought to be bronchitis.  She has been avoiding caffeine.   The palpitations seem to be a regular tachycardia that last for several minutes. She usually sits down and rests and takes deep breaths.  She thinks that the palpitations gradually improve.  She has been to urgent care and also to her medical doctor's office. She is on a sinus tachycardia during those visits.  She's had very thorough workup. She has been found to have an elevated prolactin level. She's noted that her periods are somewhat irregular.  Her TSH level was normal.  Feb. 24, 2014:  Xiomi is doing well.  She has an elevated prolactin level ( now taking Bromocriptine).  She had originally presented with  fatigue.    She has had episodes of tachycardia.  Event monitor has revealed several episodes of SVT at 150.   Current Outpatient Prescriptions on File Prior to Visit  Medication Sig Dispense Refill  . acetaminophen (TYLENOL) 325 MG tablet Take 650 mg by mouth every 6 (six) hours as needed. For fever       No current facility-administered medications on file prior to visit.    Allergies  Allergen Reactions  . Doxycycline Hyclate     REACTION: nausea , dizzines  . Penicillins Hives  .  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 Palpitation   . Tachycardia     No past surgical history on file.  History  Smoking status  . Never Smoker   Smokeless tobacco  . Not on file    History  Alcohol Use No    No family history on file.  Reviw of Systems:  Reviewed in the HPI.  All other systems are negative.  Physical Exam: Blood pressure 105/71, pulse 71, height 5\' 1"  (1.549 m), weight 106 lb 6.4 oz (48.263 kg). General: Well developed, well nourished, in no acute distress.  Head: Normocephalic, atraumatic, sclera non-icteric, mucus membranes are moist,   Neck: Supple. Carotids are 2 + without bruits. No JVD   Lungs: Clear   Heart: RR, no significant click or murmur  Abdomen: Soft, non-tender, non-distended with normal bowel sounds. I was able to palpate her abdomina aorta, no bruits  Msk:  Strength and tone are normal   Extremities: No clubbing or cyanosis. No edema.  Distal pedal pulses are 2+ and equal    Neuro: CN II - XII intact.  Alert and oriented X 3.   Psych:  Normal   ECG: She's had several EKGs in the past. These have  shown sinus tachycardia in the 120 range.  Assessment / Plan:

## 2012-05-06 NOTE — Patient Instructions (Signed)
Your physician has requested that you have an echocardiogram. Echocardiography is a painless test that uses sound waves to create images of your heart. It provides your doctor with information about the size and shape of your heart and how well your heart's chambers and valves are working. This procedure takes approximately one hour. There are no restrictions for this procedure.  Your physician recommends that you schedule a follow-up appointment in: 3 MONTHS WITH EKG   Your physician has recommended you make the following change in your medication:   START PROPRANOLOL 10 MG AS NEEDED UP TO FOUR TIMES DAILY FOR PALPITATIONS (CAN TAKE 30 MINUTES APART)

## 2012-05-06 NOTE — Assessment & Plan Note (Signed)
Michele Price presents for followup of her episodes of tachycardia. She says she's had a few episodes since last saw her. She will event monitor and was found to have several episodes of a narrow complex tachycardia around 150 beats a minute. The event monitor suggests that she has P-wave present before each QRS suggesting that this is a sinus tachycardia.  She tried a Valsalva maneuver and stimulation of the diving reflex but these did not slow her tachycardia.  At this point I reassured her that this is probably a sinus tachycardia. We'll continue to monitor her.  We'll call her in a prescription for propranolol 10 mg tablets to take 4 times a day as needed. We'll get an echocardiogram to ensure that her cardiac size and function are normal. Posterior again in 3 months for followup visit

## 2012-05-10 ENCOUNTER — Ambulatory Visit (HOSPITAL_COMMUNITY): Payer: Managed Care, Other (non HMO) | Attending: Internal Medicine | Admitting: Radiology

## 2012-05-10 DIAGNOSIS — R000 Tachycardia, unspecified: Secondary | ICD-10-CM | POA: Insufficient documentation

## 2012-05-10 NOTE — Progress Notes (Signed)
Echocardiogram performed.  

## 2012-05-27 ENCOUNTER — Encounter: Payer: Managed Care, Other (non HMO) | Admitting: Internal Medicine

## 2012-08-06 ENCOUNTER — Ambulatory Visit: Payer: Managed Care, Other (non HMO) | Admitting: Cardiovascular Disease

## 2012-08-07 ENCOUNTER — Encounter: Payer: Self-pay | Admitting: Cardiovascular Disease

## 2012-08-07 ENCOUNTER — Ambulatory Visit (INDEPENDENT_AMBULATORY_CARE_PROVIDER_SITE_OTHER): Payer: Managed Care, Other (non HMO) | Admitting: Cardiovascular Disease

## 2012-08-07 VITALS — BP 102/74 | HR 78 | Ht 61.0 in | Wt 105.0 lb

## 2012-08-07 DIAGNOSIS — R002 Palpitations: Secondary | ICD-10-CM

## 2012-08-07 NOTE — Patient Instructions (Addendum)
Your physician recommends that you schedule a follow-up appointment in: AS NEEDED BASIS  Your physician recommends that you continue on your current medications as directed. Please refer to the Current Medication list given to you today.    

## 2012-08-07 NOTE — Progress Notes (Signed)
Lorin Picket Date of Birth  [birth year]       [facility]    [place] [address]  [address] [place], [place]  [address]   [place], [place]  [address] [phone]     [phone]   Fax  [phone]    Fax [phone]  Problem List: 1. Palpitations 2. Tachycardia  History of Present Illness:  Cricket ( goes by [facility])  presents for further evaluation of palpitations. She describes a sudden increase in heart rate associated with with numbness and shortness of breath. These episodes typically last 5 minutes. They're not associated with eating, drinking, exercise, or change of position.  These started about a month ago when she presented with a respiratory illness thought to be bronchitis.  She has been avoiding caffeine.   The palpitations seem to be a regular tachycardia that last for several minutes. She usually sits down and rests and takes deep breaths.  She thinks that the palpitations gradually improve.  She has been to urgent care and also to her medical doctor's office. She is on a sinus tachycardia during those visits.  She's had very thorough workup. She has been found to have an elevated prolactin level. She's noted that her periods are somewhat irregular.  Her TSH level was normal.  Feb. 24, 2014:  Xiomi is doing well.  She has an elevated prolactin level ( now taking Bromocriptine).  She had originally presented with  fatigue.    She has had episodes of tachycardia.  Event monitor has revealed several episodes of SVT at 150.   Aug 07, 2012:   Xiomi is doing well.  She is avoiding caffeine and simple carbohydrates.   She is doing well in school.  She walks at school but is not running.    Current Outpatient Prescriptions on File Prior to Visit  Medication Sig Dispense Refill  . bromocriptine (PARLODEL) 2.5 MG tablet Take 2.5 mg by mouth. Take half a tablet daily       No current facility-administered  medications on file prior to visit.    Allergies  Allergen Reactions  . Doxycycline Hyclate     REACTION: nausea , dizzines  . Penicillins Hives  . 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 Palpitation   . Tachycardia     No past surgical history on file.  History  Smoking status  . Never Smoker   Smokeless tobacco  . Not on file    History  Alcohol Use No    No family history on file.  Reviw of Systems:  Reviewed in the HPI.  All other systems are negative.  Physical Exam: Blood pressure 102/74, pulse 78, height 5\' 1"  (1.549 m), weight 105 lb (47.628 kg). General: Well developed, well nourished, in no acute distress.  Head: Normocephalic, atraumatic, sclera non-icteric, mucus membranes are moist,   Neck: Supple. Carotids are 2 + without bruits. No JVD   Lungs: Clear   Heart: RR, no significant click or murmur  Abdomen: Soft, non-tender, non-distended with normal bowel sounds. I was able to palpate her abdomina aorta, no bruits  Msk:  Strength and tone are normal   Extremities: No clubbing or cyanosis. No edema.  Distal pedal pulses are 2+ and equal    Neuro: CN II - XII intact.  Alert and oriented X 3.   Psych:  Normal  ECG: She's had several EKGs in the past. These have  shown sinus tachycardia in the 120 range.  Assessment / Plan:

## 2012-08-07 NOTE — Assessment & Plan Note (Signed)
Michele Price is doing well.  Her palpitations have resolved. She is avoiding caffeine. She is also avoiding excessive carbohydrates.  At this point we'll see her on an as-needed basis. She states a graduate next December. She has a double major at [employer]. She is planning on going to graduate school and stenting neuropsychology.

## 2013-01-16 ENCOUNTER — Other Ambulatory Visit: Payer: Self-pay

## 2013-05-15 ENCOUNTER — Emergency Department (HOSPITAL_COMMUNITY)
Admission: EM | Admit: 2013-05-15 | Discharge: 2013-05-15 | Disposition: A | Discharge status: Home / Self Care | Payer: Managed Care, Other (non HMO) | Source: Home / Self Care | Attending: Emergency Medicine | Admitting: Emergency Medicine

## 2013-05-15 ENCOUNTER — Encounter (HOSPITAL_COMMUNITY): Payer: Self-pay | Admitting: Emergency Medicine

## 2013-05-15 DIAGNOSIS — J029 Acute pharyngitis, unspecified: Secondary | ICD-10-CM

## 2013-05-15 LAB — POCT RAPID STREP A: Streptococcus, Group A Screen (Direct): NEGATIVE

## 2013-05-15 MED ORDER — DICLOFENAC SODIUM 75 MG PO TBEC: 75.0000 mg | DELAYED_RELEASE_TABLET | Freq: Two times a day (BID) | ORAL | Status: DC

## 2013-05-15 NOTE — Discharge Instructions (Signed)

## 2013-05-15 NOTE — ED Provider Notes (Signed)
Chief Complaint   Chief Complaint  Patient presents with  . Sore Throat  . Otalgia    History of Present Illness   Michele Price is a 23 year old female who has had a two-day history of severe sore throat, pain on swallowing, and the pain radiates towards both ears. She's had a moderate headache, swollen glands, but no fever, chills, nasal congestion, rhinorrhea, or cough. She denies any GI symptoms. She's had no known sick exposures. She has a history of strep in the past.   Review of Systems   Other than as noted above, the patient denies any of the following symptoms. Systemic:  No fever, chills, sweats, myalgias, or headache. Eye:  No redness, pain or drainage. ENT:  No earache, nasal congestion, sneezing, rhinorrhea, sinus pressure, sinus pain, or post nasal drip. Lungs:  No cough, sputum production, wheezing, shortness of breath, or chest pain. GI:  No abdominal pain, nausea, vomiting, or diarrhea. Skin:  No rash.  PMFSH   Past medical history, family history, social history, meds, and allergies were reviewed. She is allergic to vancomycin and may be allergic to penicillin as well. She has a history of tachycardia and elevated prolactin levels.  Physical Exam     Vital signs:  BP 109/69  Pulse 82  Temp(Src) 98.5 F (36.9 C) (Oral)  Resp 16  SpO2 100%  LMP 05/15/2012 General:  Alert, in no distress. Phonation was normal, no drooling, and patient was able to handle secretions well.  Eye:  No conjunctival injection or drainage. Lids were normal. ENT:  TMs and canals were normal, without erythema or inflammation.  Nasal mucosa was clear and uncongested, without drainage.  Mucous membranes were moist.  Exam of pharynx pharynx is erythematous but no swelling, tonsillar enlargement, exudate, or drainage.  There were no oral ulcerations or lesions. There was no bulging of the tonsillar pillars, and the uvula was midline. Neck:  Supple, no adenopathy, tenderness or  mass. Lungs:  No respiratory distress.  Lungs were clear to auscultation, without wheezes, rales or rhonchi.  Breath sounds were clear and equal bilaterally.  Heart:  Regular rhythm, without gallops, murmers or rubs. Skin:  Clear, warm, and dry, without rash or lesions.  Labs   Results for orders placed during the hospital encounter of 05/15/13  POCT RAPID STREP A ([facility] ONLY)      Result Value Ref Range   Streptococcus, Group A Screen (Direct) NEGATIVE  NEGATIVE   Assessment   The encounter diagnosis was Viral pharyngitis.  There is no evidence of a peritonsillar abscess.    Plan     1.  Meds:  The following meds were prescribed:   Discharge Medication List as of 05/15/2013  2:48 PM    START taking these medications   Details  diclofenac (VOLTAREN) 75 MG EC tablet Take 1 tablet (75 mg total) by mouth 2 (two) times daily., Starting 05/15/2013, Until Discontinued, Normal        2.  Patient Education/Counseling:  The patient was given appropriate handouts, self care instructions, and instructed in symptomatic relief, including hot saline gargles, throat lozenges, infectious precautions, and need to trade out toothbrush.    3.  Follow up:  The patient was told to follow up here if no better in 3 to 4 days, or sooner if becoming worse in any way, and given some red flag symptoms such as difficulty swallowing or breathing which would prompt immediate return.  Reuben Likesavid C [place], MD 05/15/13 [phone]

## 2013-05-15 NOTE — ED Notes (Signed)
C/o sore throat, pain in ears with swallowing, minimal cough, no fever

## 2013-05-17 LAB — CULTURE, GROUP A STREP

## 2014-05-08 IMAGING — CR DG CHEST 2V
2 series · 2 of 2 positions shown · non-contrast
Comparison: 02/07/2011

CLINICAL DATA: Pt complains of mid chest discomfort for 2 weeks;
yesterday started having pain; no injury; non smoker; no other
chest complaints

CHEST - 2 VIEW

[view not recorded (1 of 2)]
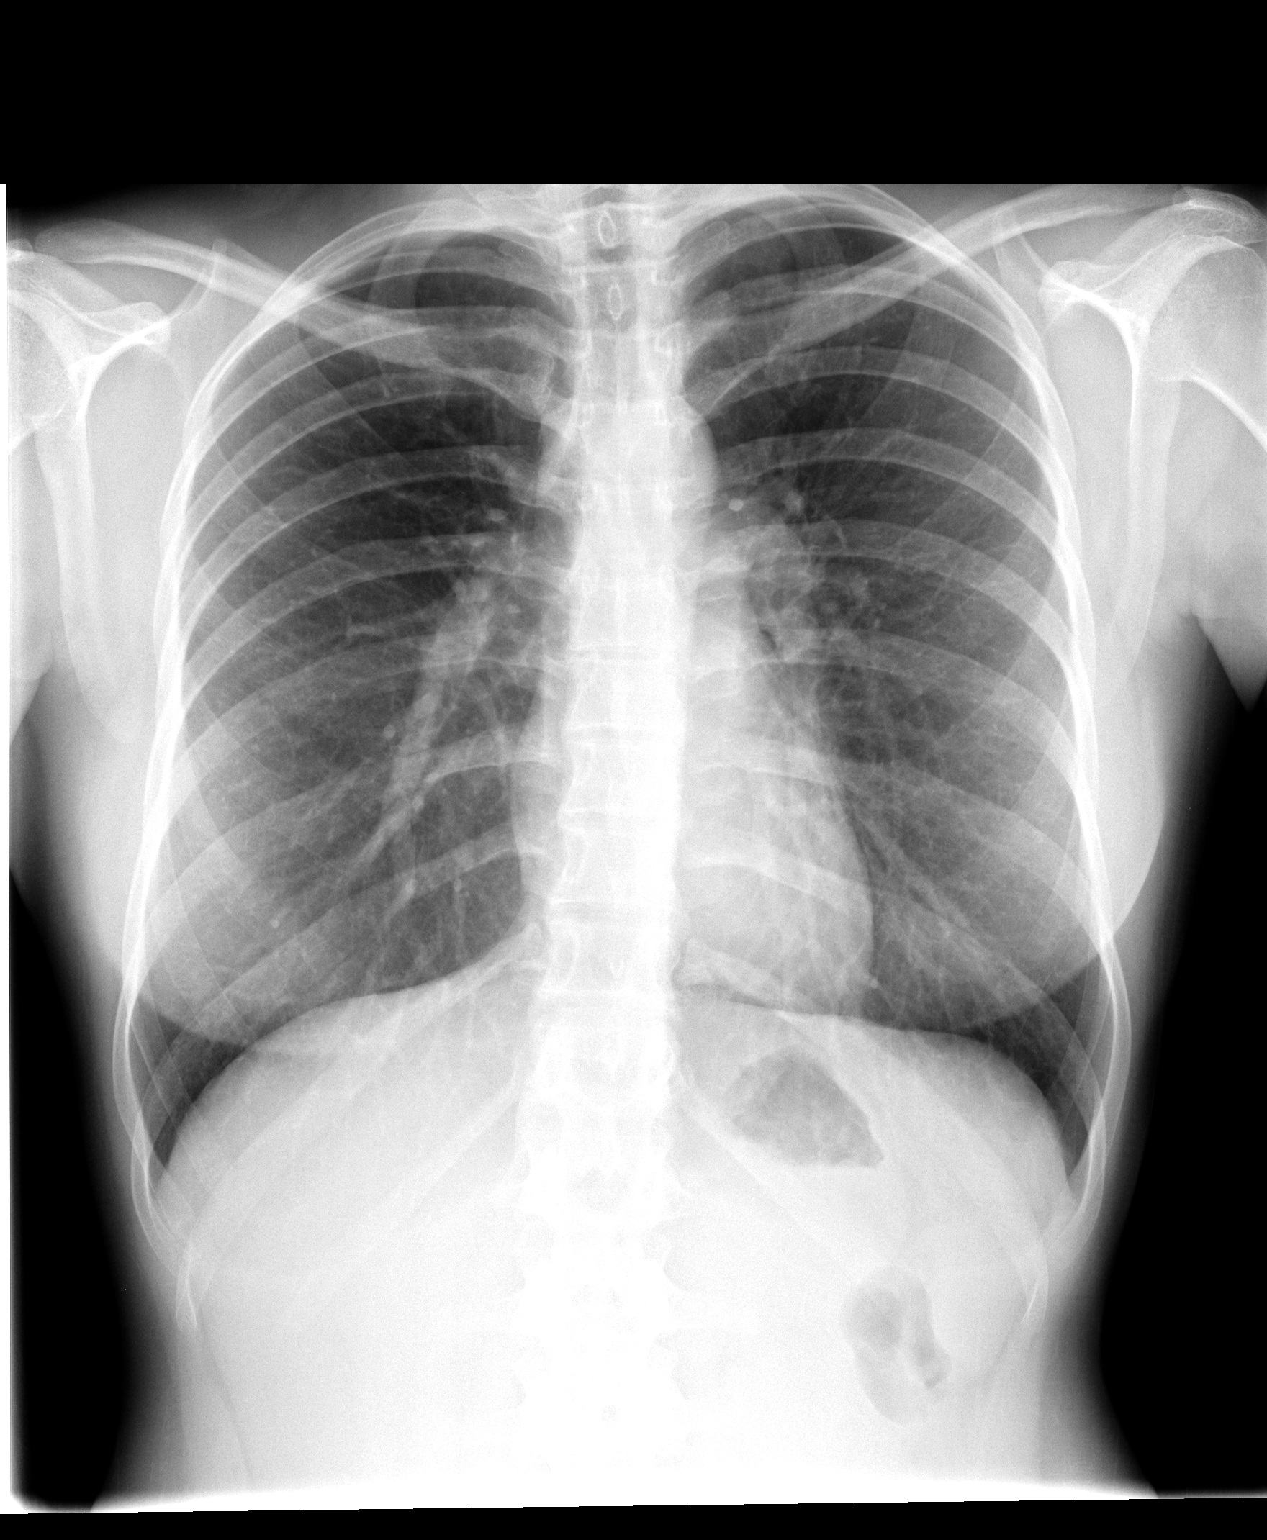

[view not recorded (2 of 2)]
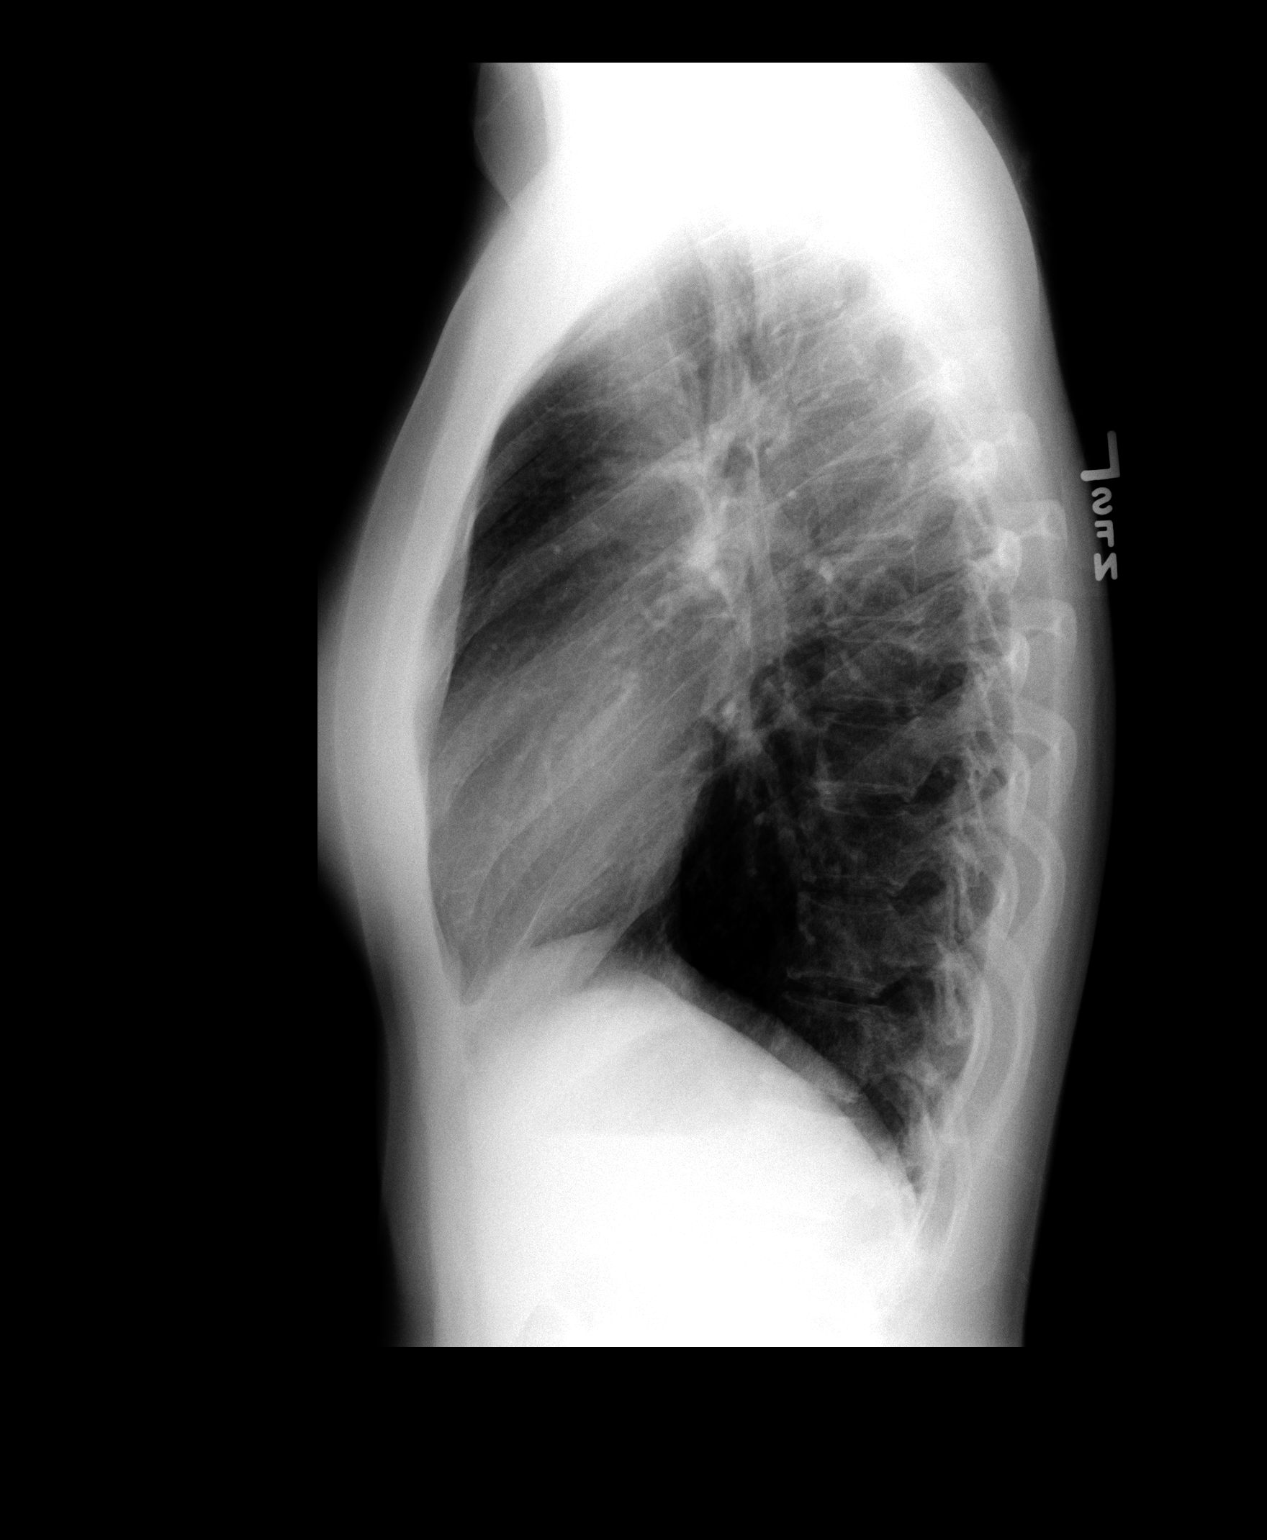

[2 of 2 positions shown; findings below may reference images not displayed]

FINDINGS: The lungs appear clear.  No significant abnormality of
the heart or mediastinum identified.  No pleural effusion,
pneumothorax, or findings of pneumonia.
IMPRESSION: No significant abnormality identified.

## 2020-03-13 NOTE — L&D Delivery Note (Signed)
Delivery Note At 9:39 AM a viable and healthy female was delivered via Vaginal, Spontaneous (Presentation:  LOA    ).  APGAR: 9, 9; weight pending .   Placenta status: Spontaneous, Intact.  Cord: 3 vessels with the following complications: None.  Cord pH: n/a  Anesthesia: Epidural Episiotomy: None Lacerations: 2nd degree Suture Repair: 3.0 vicryl Est. Blood Loss (mL):    Mom to postpartum.  Baby to Couplet care / Skin to Skin.  Michele Price 02/03/2021, 10:03 AM

## 2020-07-07 DIAGNOSIS — Z3201 Encounter for pregnancy test, result positive: Secondary | ICD-10-CM | POA: Diagnosis not present

## 2020-07-07 DIAGNOSIS — K219 Gastro-esophageal reflux disease without esophagitis: Secondary | ICD-10-CM | POA: Diagnosis not present

## 2020-07-22 DIAGNOSIS — Z369 Encounter for antenatal screening, unspecified: Secondary | ICD-10-CM | POA: Diagnosis not present

## 2020-07-22 DIAGNOSIS — R875 Abnormal microbiological findings in specimens from female genital organs: Secondary | ICD-10-CM | POA: Diagnosis not present

## 2020-07-22 DIAGNOSIS — Z124 Encounter for screening for malignant neoplasm of cervix: Secondary | ICD-10-CM | POA: Diagnosis not present

## 2020-07-22 LAB — OB RESULTS CONSOLE RUBELLA ANTIBODY, IGM: Rubella: IMMUNE

## 2020-07-22 LAB — OB RESULTS CONSOLE HIV ANTIBODY (ROUTINE TESTING): HIV: NONREACTIVE

## 2020-07-22 LAB — OB RESULTS CONSOLE RPR: RPR: NONREACTIVE

## 2020-08-10 ENCOUNTER — Encounter: Payer: Self-pay | Admitting: Internal Medicine

## 2020-08-10 ENCOUNTER — Other Ambulatory Visit: Payer: Self-pay

## 2020-08-10 ENCOUNTER — Ambulatory Visit (INDEPENDENT_AMBULATORY_CARE_PROVIDER_SITE_OTHER): Payer: 59 | Admitting: Internal Medicine

## 2020-08-10 VITALS — BP 110/80 | HR 102 | Temp 99.0°F | Ht 61.0 in | Wt 132.4 lb

## 2020-08-10 DIAGNOSIS — E221 Hyperprolactinemia: Secondary | ICD-10-CM | POA: Insufficient documentation

## 2020-08-10 DIAGNOSIS — Z1239 Encounter for other screening for malignant neoplasm of breast: Secondary | ICD-10-CM

## 2020-08-10 DIAGNOSIS — Z833 Family history of diabetes mellitus: Secondary | ICD-10-CM

## 2020-08-10 DIAGNOSIS — K219 Gastro-esophageal reflux disease without esophagitis: Secondary | ICD-10-CM | POA: Diagnosis not present

## 2020-08-10 DIAGNOSIS — Z3A13 13 weeks gestation of pregnancy: Secondary | ICD-10-CM | POA: Diagnosis not present

## 2020-08-10 LAB — POCT GLYCOSYLATED HEMOGLOBIN (HGB A1C): Hemoglobin A1C: 5 % (ref 4.0–5.6)

## 2020-08-10 NOTE — Progress Notes (Signed)
New Patient Office Visit     This visit occurred during the SARS-CoV-2 public health emergency.  Safety protocols were in place, including screening questions prior to the visit, additional usage of staff PPE, and extensive cleaning of exam room while observing appropriate contact time as indicated for disinfecting solutions.    CC/Reason for Visit: Establish care, discuss acute concerns Previous PCP: In [place] Last Visit: Unknown  HPI: Michele Price is a 30 y.o. female who is coming in today for the above mentioned reasons. 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  She moved from [place].  She is currently [redacted] weeks pregnant followed by Dr. Larina Earthly.  She has been diagnosed with hyperemesis gravidarum.  She tells me she has lost about 15 pounds throughout this pregnancy.  She is currently on promethazine.  She does not smoke, prior to pregnancy she would drink occasionally.  She has allergies to vancomycin, penicillin and doxycycline.  She has had no prior surgeries.  Her family history significant for hypertension with both parents and 2 siblings, she also has a strong family history of premature breast cancer with a cousin being diagnosed in her 48s, maternal aunt being diagnosed in her 62s, also maternal grandmother and great-grandmother had breast cancer.  She has had BRCA testing twice and was told that it was indeterminate and per report was recommended a 38-monthfollow-up.  She is requesting her A1c be checked given her father has been prediabetic.   Past Medical/Surgical History: Past Medical History:  Diagnosis Date  . Palpitation   . Tachycardia     History reviewed. No pertinent surgical history.  Social History:  reports that she has never smoked. She has never used smokeless tobacco. She reports that she does not drink alcohol and does not use drugs.  Allergies: Allergies   Allergen Reactions  . Banana Anaphylaxis and Shortness Of Breath  . Pineapple Anaphylaxis and Shortness Of Breath  . Doxycycline Hyclate     REACTION: nausea , dizzines  . Eggs Or Egg-Derived Products   . Penicillins Hives  . Vancomycin     REACTION: red man syndrome per patient  . Latex Rash and Swelling    Family History:  Family History  Problem Relation Age of Onset  . Hypertension Mother   . Hypertension Father   . Hypertension Brother   . Breast cancer Maternal Grandmother   . Hypertension Brother   . Breast cancer Maternal Aunt   . Breast cancer Cousin      Current Outpatient Medications:  .  famotidine (PEPCID) 20 MG tablet, Take 20 mg by mouth 2 (two) times daily., Disp: , Rfl:  .  folic acid (FOLVITE) 1 MG tablet, Take 1 mg by mouth daily., Disp: , Rfl:  .  Prenatal MV-Min-Fe Fum-FA-DHA (PRENATAL 1 PO), Take by mouth., Disp: , Rfl:  .  promethazine (PHENERGAN) 25 MG tablet, Take 25 mg by mouth every 6 (six) hours as needed for nausea or vomiting., Disp: , Rfl:   Review of Systems:  Constitutional: Denies fever, chills, diaphoresis, appetite change and fatigue.  HEENT: Denies photophobia, eye pain, redness, hearing loss, ear pain, congestion, sore throat, rhinorrhea, sneezing, mouth sores, trouble swallowing, neck pain, neck stiffness and tinnitus.   Respiratory: Denies SOB, DOE, cough, chest tightness,  and wheezing.   Cardiovascular: Denies chest pain, palpitations and leg swelling.  Gastrointestinal: Denies abdominal pain, diarrhea, constipation, blood in stool and  abdominal distention.  Genitourinary: Denies dysuria, urgency, frequency, hematuria, flank pain and difficulty urinating.  Endocrine: Denies: hot or cold intolerance, sweats, changes in hair or nails, polyuria, polydipsia. Musculoskeletal: Denies myalgias, back pain, joint swelling, arthralgias and gait problem.  Skin: Denies pallor, rash and wound.  Neurological: Denies dizziness, seizures, syncope,  weakness, light-headedness, numbness and headaches.  Hematological: Denies adenopathy. Easy bruising, personal or family bleeding history  Psychiatric/Behavioral: Denies suicidal ideation, mood changes, confusion, nervousness, sleep disturbance and agitation    Physical Exam: Vitals:   08/10/20 1540  BP: 110/80  Pulse: (!) 102  Temp: 99 F (37.2 C)  TempSrc: Oral  SpO2: 97%  Weight: 132 lb 6.4 oz (60.1 kg)  Height: 5' 1" (1.549 m)   Body mass index is 25.02 kg/m.  Constitutional: NAD, calm, comfortable Eyes: PERRL, lids and conjunctivae normal ENMT: Mucous membranes are moist.  Respiratory: clear to auscultation bilaterally, no wheezing, no crackles. Normal respiratory effort. No accessory muscle use.  Cardiovascular: Regular rate and rhythm, no murmurs / rubs / gallops. No extremity edema.  Neurologic: Grossly intact and nonfocal Psychiatric: Normal judgment and insight. Alert and oriented x 3. Normal mood.    Impression and Plan:  [redacted] weeks gestation of pregnancy  -Per her request, A1c was drawn which is normal at 5.0. -She has hyperemesis gravidarum and will continue follow-up with Dr. Kelly Fogleman. -I will be available for her after her delivery.  Breast cancer screening, high risk patient -I believe it would be beneficial to have her referred to the high risk breast clinic after her delivery for genetic counseling and testing.  Hyperprolactinemia (HCC) -Per report her most recent prolactin levels have been normal, she used to be on bromocriptine in the past  Gastroesophageal reflux disease without esophagitis -Has worsened recently with pregnancy.     Patient Instructions  -Nice seeing you today!!  -A1c is normal at 5.0.  -Schedule follow up with me after your pregnancy. Good luck!     Estela Hernandez Acosta, MD [facility]  

## 2020-08-10 NOTE — Patient Instructions (Signed)
-  Nice seeing you today!!  -A1c is normal at 5.0.  -Schedule follow up with me after your pregnancy. Good luck!

## 2020-08-18 DIAGNOSIS — R102 Pelvic and perineal pain: Secondary | ICD-10-CM | POA: Diagnosis not present

## 2020-09-16 DIAGNOSIS — Z363 Encounter for antenatal screening for malformations: Secondary | ICD-10-CM | POA: Diagnosis not present

## 2020-09-16 DIAGNOSIS — Z3A18 18 weeks gestation of pregnancy: Secondary | ICD-10-CM | POA: Diagnosis not present

## 2020-09-16 DIAGNOSIS — Z361 Encounter for antenatal screening for raised alphafetoprotein level: Secondary | ICD-10-CM | POA: Diagnosis not present

## 2020-09-16 DIAGNOSIS — O219 Vomiting of pregnancy, unspecified: Secondary | ICD-10-CM | POA: Diagnosis not present

## 2020-10-20 DIAGNOSIS — Z362 Encounter for other antenatal screening follow-up: Secondary | ICD-10-CM | POA: Diagnosis not present

## 2020-11-23 DIAGNOSIS — Z20822 Contact with and (suspected) exposure to covid-19: Secondary | ICD-10-CM | POA: Diagnosis not present

## 2020-11-23 DIAGNOSIS — Z1159 Encounter for screening for other viral diseases: Secondary | ICD-10-CM | POA: Diagnosis not present

## 2020-11-23 DIAGNOSIS — Z3A27 27 weeks gestation of pregnancy: Secondary | ICD-10-CM | POA: Diagnosis not present

## 2020-11-23 DIAGNOSIS — O26892 Other specified pregnancy related conditions, second trimester: Secondary | ICD-10-CM | POA: Diagnosis not present

## 2020-11-23 DIAGNOSIS — Z79899 Other long term (current) drug therapy: Secondary | ICD-10-CM | POA: Diagnosis not present

## 2020-11-23 DIAGNOSIS — R197 Diarrhea, unspecified: Secondary | ICD-10-CM | POA: Diagnosis not present

## 2020-11-23 DIAGNOSIS — O99612 Diseases of the digestive system complicating pregnancy, second trimester: Secondary | ICD-10-CM | POA: Diagnosis not present

## 2020-11-23 DIAGNOSIS — O210 Mild hyperemesis gravidarum: Secondary | ICD-10-CM | POA: Diagnosis not present

## 2020-11-23 DIAGNOSIS — O26812 Pregnancy related exhaustion and fatigue, second trimester: Secondary | ICD-10-CM | POA: Diagnosis not present

## 2020-11-23 DIAGNOSIS — O2342 Unspecified infection of urinary tract in pregnancy, second trimester: Secondary | ICD-10-CM | POA: Diagnosis not present

## 2020-11-23 LAB — OB RESULTS CONSOLE HEPATITIS B SURFACE ANTIGEN: Hepatitis B Surface Ag: NEGATIVE

## 2020-12-17 DIAGNOSIS — Z3689 Encounter for other specified antenatal screening: Secondary | ICD-10-CM | POA: Diagnosis not present

## 2020-12-17 DIAGNOSIS — O99013 Anemia complicating pregnancy, third trimester: Secondary | ICD-10-CM | POA: Diagnosis not present

## 2020-12-17 DIAGNOSIS — O99019 Anemia complicating pregnancy, unspecified trimester: Secondary | ICD-10-CM | POA: Diagnosis not present

## 2020-12-22 ENCOUNTER — Other Ambulatory Visit: Payer: Self-pay | Admitting: Obstetrics

## 2020-12-22 DIAGNOSIS — O99013 Anemia complicating pregnancy, third trimester: Secondary | ICD-10-CM

## 2020-12-24 ENCOUNTER — Ambulatory Visit (INDEPENDENT_AMBULATORY_CARE_PROVIDER_SITE_OTHER): Payer: 59 | Admitting: *Deleted

## 2020-12-24 ENCOUNTER — Other Ambulatory Visit: Payer: Self-pay

## 2020-12-24 DIAGNOSIS — Z23 Encounter for immunization: Secondary | ICD-10-CM | POA: Diagnosis not present

## 2020-12-28 ENCOUNTER — Encounter (HOSPITAL_COMMUNITY): Payer: Self-pay | Admitting: Obstetrics and Gynecology

## 2020-12-28 ENCOUNTER — Inpatient Hospital Stay (HOSPITAL_COMMUNITY)
Admission: AD | Admit: 2020-12-28 | Discharge: 2020-12-28 | Disposition: A | Discharge status: Home / Self Care | Payer: 59 | Attending: Obstetrics and Gynecology | Admitting: Obstetrics and Gynecology

## 2020-12-28 ENCOUNTER — Other Ambulatory Visit: Payer: Self-pay

## 2020-12-28 DIAGNOSIS — R102 Pelvic and perineal pain: Secondary | ICD-10-CM | POA: Diagnosis not present

## 2020-12-28 DIAGNOSIS — Z880 Allergy status to penicillin: Secondary | ICD-10-CM | POA: Insufficient documentation

## 2020-12-28 DIAGNOSIS — O4703 False labor before 37 completed weeks of gestation, third trimester: Secondary | ICD-10-CM

## 2020-12-28 DIAGNOSIS — Z3A33 33 weeks gestation of pregnancy: Secondary | ICD-10-CM | POA: Insufficient documentation

## 2020-12-28 DIAGNOSIS — O479 False labor, unspecified: Secondary | ICD-10-CM

## 2020-12-28 DIAGNOSIS — O368130 Decreased fetal movements, third trimester, not applicable or unspecified: Secondary | ICD-10-CM | POA: Diagnosis not present

## 2020-12-28 DIAGNOSIS — O99891 Other specified diseases and conditions complicating pregnancy: Secondary | ICD-10-CM | POA: Diagnosis not present

## 2020-12-28 LAB — URINALYSIS, ROUTINE W REFLEX MICROSCOPIC
Bilirubin Urine: NEGATIVE
Glucose, UA: NEGATIVE mg/dL
Ketones, ur: 5 mg/dL — AB
Leukocytes,Ua: NEGATIVE
Nitrite: NEGATIVE
Protein, ur: NEGATIVE mg/dL
Specific Gravity, Urine: 1.027 (ref 1.005–1.030)
pH: 5 (ref 5.0–8.0)

## 2020-12-28 MED ORDER — LACTATED RINGERS IV BOLUS
1000.0000 mL | Freq: Once | INTRAVENOUS | Status: AC
  Administered 2020-12-28: 1000 mL via INTRAVENOUS

## 2020-12-28 NOTE — MAU Provider Note (Signed)
Chief Complaint:  Decreased Fetal Movement, Pelvic Pain, and Dehydration   HPI: Michele Price is a 30 y.o. G1P0 at [redacted]w[redacted]d who presents to maternity admissions reporting pelvic pain and cramping. Pain has been ongoing since the beginning of October however worsened on Friday. Today her pain is less than it was on Friday, reporting only mild cramping. Today she was evaluated by her OB and was told she was having contractions every 7 mins apart and sent here for IVF's for possible dehydration. Patient reports that she has had decreased fetal movement since the pain started, but reports she doesn't feel movement only when the pain occurs. Since her arrival, she continues to report only mild cramping and endorses fetal movement.  Of note, patient is an [employer] fund[employer] and reports that she does notice pain increases when she does a lot of activity, especially while she is at work. She also reports that she does not drink a lot of water because it is a trigger for her hyperemesis. Reports yesterday she only had 3 Peligrino waters and 2-3 cups of apple juice.  Pregnancy Course:   Past Medical History:  Diagnosis Date   GERD (gastroesophageal reflux disease)    Hyperprolactinemia (HCC)    Palpitation    Tachycardia    OB History  Gravida Para Term Preterm AB Living  2       1    SAB IAB Ectopic Multiple Live Births  1            # Outcome Date GA Lbr Len/2nd Weight Sex Delivery Anes PTL Lv  2 Current           1 SAB            Past Surgical History:  Procedure Laterality Date   NO PAST SURGERIES     Family History  Problem Relation Age of Onset   Hypertension Mother    Hypertension Father    Hypertension Brother    Breast cancer Maternal Grandmother    Hypertension Brother    Breast cancer Maternal Aunt    Breast cancer Cousin    Social History   Tobacco Use   Smoking status: Never   Smokeless tobacco: Never  Vaping Use   Vaping Use: Never used  Substance Use Topics   Alcohol use: No    Drug use: Never   Allergies  Allergen Reactions   Banana Anaphylaxis and Shortness Of Breath   Pineapple Anaphylaxis and Shortness Of Breath   Doxycycline Hyclate     REACTION: nausea , dizzines   Eggs Or Egg-Derived Products    Penicillins Hives   Vancomycin     REACTION: red man syndrome per patient   Latex Rash and Swelling   No medications prior to admission.   I have reviewed patient's Past Medical Hx, Surgical Hx, Family Hx, Social Hx, medications and allergies.   ROS:  Review of Systems  Constitutional: Negative.   Respiratory: Negative.    Cardiovascular: Negative.   Gastrointestinal:  Positive for abdominal pain (cramping).  Genitourinary:  Positive for vaginal bleeding (bloody show from cervical exam this morning).  Musculoskeletal: Negative.   Neurological: Negative.    Physical Exam  Patient Vitals for the past 24 hrs:  BP Pulse SpO2  12/28/20 1550 115/69 65 --  12/28/20 1518 114/75 76 98 %  12/28/20 1354 109/71 79 --   Constitutional: well-developed, well-nourished female in no acute distress.  HEENT: atraumatic, normocephalic, PERRL Cardiovascular: normal rate Respiratory: normal effort GI:  abd soft, non-tender, gravid  MS: extremities nontender, no edema, normal ROM Neurologic: alert and oriented x 4.  Pelvic: not indicated  Dilation: Closed Effacement (%): Thick Exam by:: Camelia Eng, CNM  FHT: Baseline 135 bpm, moderate variability, 15x15 accelerations present, no decelerations Toco: q 4-10 mins   Labs: Results for orders placed or performed during the hospital encounter of 12/28/20 (from the past 24 hour(s))  Urinalysis, Routine w reflex microscopic Urine, Clean Catch     Status: Abnormal   Collection Time: 12/28/20  1:45 PM  Result Value Ref Range   Color, Urine YELLOW YELLOW   APPearance HAZY (A) CLEAR   Specific Gravity, Urine 1.027 1.005 - 1.030   pH 5.0 5.0 - 8.0   Glucose, UA NEGATIVE NEGATIVE mg/dL   Hgb urine dipstick SMALL  (A) NEGATIVE   Bilirubin Urine NEGATIVE NEGATIVE   Ketones, ur 5 (A) NEGATIVE mg/dL   Protein, ur NEGATIVE NEGATIVE mg/dL   Nitrite NEGATIVE NEGATIVE   Leukocytes,Ua NEGATIVE NEGATIVE   RBC / HPF 0-5 0 - 5 RBC/hpf   WBC, UA 0-5 0 - 5 WBC/hpf   Bacteria, UA RARE (A) NONE SEEN   Squamous Epithelial / LPF 0-5 0 - 5   Mucus PRESENT     Imaging:  No results found.  MAU Course: Orders Placed This Encounter  Procedures   Urinalysis, Routine w reflex microscopic Urine, Clean Catch   Discharge patient   Meds ordered this encounter  Medications   lactated ringers bolus 1,000 mL    MDM: UA unremarkable IVF bolus. Discussed use of Procardia for PTL, however patient hesitant given that it is used to treat hypertension. Cervical exam, closed/thick/posterior, which is unchanged from exam in office today NST reactive and reassuring Toco with irregular contractions. Patient reports pain is occasional and very mild, rating 2/10 We discussed at length preterm labor precautions and strict return precautions   Assessment: 1. Braxton Hick's contraction   2. [redacted] weeks gestation of pregnancy     Plan: Discharge home in stable condition  Preterm labor precautions and fetal kick counts reviewed Work restriction note provided  Keep appointment as scheduled on 10/24 at [facility] Return to MAU as needed    Follow-up Information     Ob[facility]yn, [facility] Follow up.   Why: as scheduled on Monday 10/24, Return to MAU as needed. Contact information: [address] [place] [address] [phone]                 Allergies as of 12/28/2020       Reactions   Banana Anaphylaxis, Shortness Of Breath   Pineapple Anaphylaxis, Shortness Of Breath   Doxycycline Hyclate    REACTION: nausea , dizzines   Eggs Or Egg-derived Products    Penicillins Hives   Vancomycin    REACTION: red man syndrome per patient   Latex Rash, Swelling        Medication List     TAKE these  medications    famotidine 20 MG tablet Commonly known as: PEPCID Take 20 mg by mouth 2 (two) times daily.   folic acid 1 MG tablet Commonly known as: FOLVITE Take 1 mg by mouth daily.   pantoprazole 40 MG tablet Commonly known as: PROTONIX Take 40 mg by mouth daily.   PRENATAL 1 PO Take by mouth.   promethazine 25 MG tablet Commonly known as: PHENERGAN Take 25 mg by mouth every 6 (six) hours as needed for nausea or vomiting.  Camelia Eng, CNM 12/28/2020 4:35 PM

## 2020-12-28 NOTE — MAU Note (Addendum)
...  Michele Price is a 30 y.o. at [redacted]w[redacted]d here in MAU reporting: sent over from office to receive IV fluids. Patient states she was told she is dehydrated. She states she received an NST in office today and was told she was contracting every 7 minutes. She is endorsing lower pelvic pain that started two days ago and states it is hard to walk. She states this pain feels "crampy" and not like braxton hicks. She states since she has been having this pain she feels as if her baby does not move as much as he normally does and she is fearful because of her previous miscarriage. Denies LOF. States she just noticed light vaginal bleeding while using the restroom here in MAU. She states she had her cervix checked in office today.  Lab orders placed from triage: UA

## 2020-12-29 ENCOUNTER — Non-Acute Institutional Stay (HOSPITAL_COMMUNITY)
Admission: RE | Admit: 2020-12-29 | Discharge: 2020-12-29 | Disposition: A | Discharge status: Home / Self Care | Payer: 59 | Source: Ambulatory Visit | Attending: Internal Medicine | Admitting: Internal Medicine

## 2020-12-29 DIAGNOSIS — D649 Anemia, unspecified: Secondary | ICD-10-CM | POA: Diagnosis not present

## 2020-12-29 DIAGNOSIS — O99013 Anemia complicating pregnancy, third trimester: Secondary | ICD-10-CM | POA: Diagnosis not present

## 2020-12-29 MED ORDER — SODIUM CHLORIDE 0.9 % IV SOLN
INTRAVENOUS | Status: DC | PRN
  Administered 2020-12-29: 250 mL via INTRAVENOUS

## 2020-12-29 MED ORDER — SODIUM CHLORIDE 0.9 % IV SOLN
500.0000 mg | Freq: Once | INTRAVENOUS | Status: AC
  Administered 2020-12-29: 500 mg via INTRAVENOUS
  Filled 2020-12-29: qty 25

## 2020-12-29 MED ORDER — DIPHENHYDRAMINE HCL 25 MG PO CAPS
25.0000 mg | ORAL_CAPSULE | Freq: Once | ORAL | Status: AC
  Administered 2020-12-29: 25 mg via ORAL
  Filled 2020-12-29: qty 1

## 2020-12-29 MED ORDER — ACETAMINOPHEN 500 MG PO TABS: 500.0000 mg | ORAL_TABLET | Freq: Once | ORAL | Status: DC

## 2020-12-29 NOTE — Progress Notes (Signed)
Patient received IV Venofer 500 mg  (dose #1 of 2) as ordered by Noland Fordyce MD. Pt states she took 500mg  Tylenol prior to coming for infusion, pt given Benadryl 25mg  in patient care center prior to infusion. Tolerated well, vitals stable, discharge instructions given , verbalized understanding.  Pt to RTC on 11/2 for next infusion, verbalized understanding. Patient alert, oriented, and ambulatory at the time of discharge.

## 2021-01-04 ENCOUNTER — Other Ambulatory Visit: Payer: Self-pay

## 2021-01-04 ENCOUNTER — Telehealth: Payer: Self-pay

## 2021-01-04 ENCOUNTER — Encounter (HOSPITAL_COMMUNITY): Payer: Self-pay | Admitting: Obstetrics and Gynecology

## 2021-01-04 ENCOUNTER — Inpatient Hospital Stay (HOSPITAL_COMMUNITY)
Admission: AD | Admit: 2021-01-04 | Discharge: 2021-01-05 | Disposition: A | Discharge status: Home / Self Care | Payer: 59 | Attending: Obstetrics and Gynecology | Admitting: Obstetrics and Gynecology

## 2021-01-04 DIAGNOSIS — Z881 Allergy status to other antibiotic agents status: Secondary | ICD-10-CM | POA: Diagnosis not present

## 2021-01-04 DIAGNOSIS — E860 Dehydration: Secondary | ICD-10-CM | POA: Diagnosis not present

## 2021-01-04 DIAGNOSIS — O99613 Diseases of the digestive system complicating pregnancy, third trimester: Secondary | ICD-10-CM

## 2021-01-04 DIAGNOSIS — A084 Viral intestinal infection, unspecified: Secondary | ICD-10-CM | POA: Diagnosis not present

## 2021-01-04 DIAGNOSIS — O4703 False labor before 37 completed weeks of gestation, third trimester: Secondary | ICD-10-CM | POA: Diagnosis not present

## 2021-01-04 DIAGNOSIS — Z79899 Other long term (current) drug therapy: Secondary | ICD-10-CM | POA: Insufficient documentation

## 2021-01-04 DIAGNOSIS — Z20822 Contact with and (suspected) exposure to covid-19: Secondary | ICD-10-CM | POA: Insufficient documentation

## 2021-01-04 DIAGNOSIS — K529 Noninfective gastroenteritis and colitis, unspecified: Secondary | ICD-10-CM

## 2021-01-04 DIAGNOSIS — O99283 Endocrine, nutritional and metabolic diseases complicating pregnancy, third trimester: Secondary | ICD-10-CM | POA: Diagnosis not present

## 2021-01-04 DIAGNOSIS — O218 Other vomiting complicating pregnancy: Secondary | ICD-10-CM | POA: Insufficient documentation

## 2021-01-04 DIAGNOSIS — O4700 False labor before 37 completed weeks of gestation, unspecified trimester: Secondary | ICD-10-CM

## 2021-01-04 DIAGNOSIS — Z3A34 34 weeks gestation of pregnancy: Secondary | ICD-10-CM | POA: Diagnosis not present

## 2021-01-04 DIAGNOSIS — Z3A33 33 weeks gestation of pregnancy: Secondary | ICD-10-CM

## 2021-01-04 DIAGNOSIS — Z880 Allergy status to penicillin: Secondary | ICD-10-CM | POA: Insufficient documentation

## 2021-01-04 LAB — CBC WITH DIFFERENTIAL/PLATELET
Abs Immature Granulocytes: 0.05 10*3/uL (ref 0.00–0.07)
Basophils Absolute: 0 10*3/uL (ref 0.0–0.1)
Basophils Relative: 0 %
Eosinophils Absolute: 0 10*3/uL (ref 0.0–0.5)
Eosinophils Relative: 0 %
HCT: 31.1 % — ABNORMAL LOW (ref 36.0–46.0)
Hemoglobin: 9.9 g/dL — ABNORMAL LOW (ref 12.0–15.0)
Immature Granulocytes: 1 %
Lymphocytes Relative: 14 %
Lymphs Abs: 1.1 10*3/uL (ref 0.7–4.0)
MCH: 27.7 pg (ref 26.0–34.0)
MCHC: 31.8 g/dL (ref 30.0–36.0)
MCV: 86.9 fL (ref 80.0–100.0)
Monocytes Absolute: 0.6 10*3/uL (ref 0.1–1.0)
Monocytes Relative: 8 %
Neutro Abs: 6.2 10*3/uL (ref 1.7–7.7)
Neutrophils Relative %: 77 %
Platelets: 197 10*3/uL (ref 150–400)
RBC: 3.58 MIL/uL — ABNORMAL LOW (ref 3.87–5.11)
RDW: 13.4 % (ref 11.5–15.5)
WBC: 8 10*3/uL (ref 4.0–10.5)
nRBC: 0 % (ref 0.0–0.2)

## 2021-01-04 LAB — URINALYSIS, ROUTINE W REFLEX MICROSCOPIC
Bacteria, UA: NONE SEEN
Bilirubin Urine: NEGATIVE
Glucose, UA: NEGATIVE mg/dL
Hgb urine dipstick: NEGATIVE
Ketones, ur: 80 mg/dL — AB
Leukocytes,Ua: NEGATIVE
Nitrite: NEGATIVE
Protein, ur: 30 mg/dL — AB
Specific Gravity, Urine: 1.033 — ABNORMAL HIGH (ref 1.005–1.030)
pH: 5 (ref 5.0–8.0)

## 2021-01-04 LAB — RESP PANEL BY RT-PCR (FLU A&B, COVID) ARPGX2
Influenza A by PCR: NEGATIVE
Influenza B by PCR: NEGATIVE
SARS Coronavirus 2 by RT PCR: NEGATIVE

## 2021-01-04 LAB — BASIC METABOLIC PANEL
Anion gap: 10 (ref 5–15)
BUN: 10 mg/dL (ref 6–20)
CO2: 18 mmol/L — ABNORMAL LOW (ref 22–32)
Calcium: 8.9 mg/dL (ref 8.9–10.3)
Chloride: 106 mmol/L (ref 98–111)
Creatinine, Ser: 0.62 mg/dL (ref 0.44–1.00)
GFR, Estimated: >60 mL/min (ref >60)
Glucose, Bld: 81 mg/dL (ref 70–99)
Potassium: 3.7 mmol/L (ref 3.5–5.1)
Sodium: 134 mmol/L — ABNORMAL LOW (ref 135–145)

## 2021-01-04 MED ORDER — TERBUTALINE SULFATE 1 MG/ML IJ SOLN: 0.2500 mg | Freq: Once | INTRAMUSCULAR | Status: DC

## 2021-01-04 MED ORDER — LACTATED RINGERS IV BOLUS
1000.0000 mL | Freq: Once | INTRAVENOUS | Status: AC
  Administered 2021-01-04: 1000 mL via INTRAVENOUS

## 2021-01-04 MED ORDER — SODIUM CHLORIDE 0.9 % IV SOLN
8.0000 mg | Freq: Once | INTRAVENOUS | Status: AC
  Administered 2021-01-04: 8 mg via INTRAVENOUS
  Filled 2021-01-04: qty 4

## 2021-01-04 NOTE — MAU Provider Note (Addendum)
History     CSN: [id number]  Arrival date and time: 01/04/21 2026   Event Date/Time   First Provider Initiated Contact with Patient 01/04/21 2152      No chief complaint on file.  Michele Price is a 30 y.o. G2P0010 at [redacted]w[redacted]d who receives care at [facility].  She presents today for Nausea, Vomiting, and Diarrhea.  Patient reports her symptoms started this morning around midnight and has been occurring every hour.  She reports she tried to take phenergan at 0500, but was not successful with managing her symptoms.  She endorses fetal movement.  She reports some abdominal cramping, but has no perception of contractions.  She denies vaginal bleeding, discharge, or leaking.  She reports she was potentially exposed to Norovirus by a co-worker.  She denies being around other sick individuals. She states she has been able to eat, but unable to keep anything down.  She further reports her last incident of diarrhea was around 1600.   7pm: Soup  1130: Sandwich   OB History     Gravida  2   Para      Term      Preterm      AB  1   Living         SAB  1   IAB      Ectopic      Multiple      Live Births              Past Medical History:  Diagnosis Date   GERD (gastroesophageal reflux disease)    Hyperprolactinemia (HCC)    Palpitation    Tachycardia     Past Surgical History:  Procedure Laterality Date   NO PAST SURGERIES      Family History  Problem Relation Age of Onset   Hypertension Mother    Hypertension Father    Hypertension Brother    Hypertension Brother    Breast cancer Maternal Aunt    Breast cancer Maternal Grandmother    Breast cancer Cousin     Social History   Tobacco Use   Smoking status: Never   Smokeless tobacco: Never  Vaping Use   Vaping Use: Never used  Substance Use Topics   Alcohol use: No   Drug use: Never    Allergies:  Allergies  Allergen Reactions   Banana Anaphylaxis and Shortness Of Breath   Pineapple  Anaphylaxis and Shortness Of Breath   Doxycycline Hyclate     REACTION: nausea , dizzines   Eggs Or Egg-Derived Products    Penicillins Hives   Vancomycin     REACTION: red man syndrome per patient   Latex Rash and Swelling    Medications Prior to Admission  Medication Sig Dispense Refill Last Dose   acetaminophen (TYLENOL) 500 MG tablet Take 500 mg by mouth every 6 (six) hours as needed.   01/04/2021 at 1830   pantoprazole (PROTONIX) 40 MG tablet Take 40 mg by mouth daily.   01/04/2021   Prenatal MV-Min-Fe Fum-FA-DHA (PRENATAL 1 PO) Take by mouth.   01/04/2021   promethazine (PHENERGAN) 25 MG tablet Take 25 mg by mouth every 6 (six) hours as needed for nausea or vomiting.   01/04/2021   famotidine (PEPCID) 20 MG tablet Take 20 mg by mouth 2 (two) times daily.   Unknown   folic acid (FOLVITE) 1 MG tablet Take 1 mg by mouth daily.   Unknown    Review of Systems  Eyes:  Negative for visual disturbance.  Gastrointestinal:  Positive for abdominal pain (Cramping), diarrhea (Last incident 4pm), nausea and vomiting.  Genitourinary:  Negative for difficulty urinating, dysuria, vaginal bleeding and vaginal discharge.  Neurological:  Negative for dizziness, light-headedness and headaches.  Physical Exam   Blood pressure 117/66, pulse 68, temperature 98.4 F (36.9 C), resp. rate 16, height 5\' 2"  (1.575 m), weight 63 kg, SpO2 98 %.  Physical Exam Vitals reviewed.  Constitutional:      Appearance: Normal appearance. She is ill-appearing.  HENT:     Head: Normocephalic and atraumatic.  Eyes:     Conjunctiva/sclera: Conjunctivae normal.  Cardiovascular:     Rate and Rhythm: Normal rate and regular rhythm.  Pulmonary:     Effort: Pulmonary effort is normal. No respiratory distress.     Breath sounds: Normal breath sounds.  Abdominal:     General: Bowel sounds are normal.  Musculoskeletal:        General: Normal range of motion.     Cervical back: Normal range of motion.     Right  lower leg: No edema.     Left lower leg: No edema.  Skin:    General: Skin is warm and dry.  Neurological:     Mental Status: She is alert and oriented to person, place, and time.  Psychiatric:        Mood and Affect: Mood normal.        Behavior: Behavior normal.        Thought Content: Thought content normal.    Fetal Assessment 140 bpm, Mod Var, -Decels, +15x15 Accels Toco: Q72min, palpates moderate  MAU Course   Results for orders placed or performed during the hospital encounter of 01/04/21 (from the past 24 hour(s))  Urinalysis, Routine w reflex microscopic Nasopharyngeal Swab     Status: Abnormal   Collection Time: 01/04/21  8:44 PM  Result Value Ref Range   Color, Urine AMBER (A) YELLOW   APPearance HAZY (A) CLEAR   Specific Gravity, Urine 1.033 (H) 1.005 - 1.030   pH 5.0 5.0 - 8.0   Glucose, UA NEGATIVE NEGATIVE mg/dL   Hgb urine dipstick NEGATIVE NEGATIVE   Bilirubin Urine NEGATIVE NEGATIVE   Ketones, ur 80 (A) NEGATIVE mg/dL   Protein, ur 30 (A) NEGATIVE mg/dL   Nitrite NEGATIVE NEGATIVE   Leukocytes,Ua NEGATIVE NEGATIVE   RBC / HPF 0-5 0 - 5 RBC/hpf   WBC, UA 6-10 0 - 5 WBC/hpf   Bacteria, UA NONE SEEN NONE SEEN   Squamous Epithelial / LPF 6-10 0 - 5   Mucus PRESENT    Non Squamous Epithelial 0-5 (A) NONE SEEN  Basic metabolic panel     Status: Abnormal   Collection Time: 01/04/21  9:07 PM  Result Value Ref Range   Sodium 134 (L) 135 - 145 mmol/L   Potassium 3.7 3.5 - 5.1 mmol/L   Chloride 106 98 - 111 mmol/L   CO2 18 (L) 22 - 32 mmol/L   Glucose, Bld 81 70 - 99 mg/dL   BUN 10 6 - 20 mg/dL   Creatinine, Ser 01/06/21 0.44 - 1.00 mg/dL   Calcium 8.9 8.9 - 6.43 mg/dL   GFR, Estimated 32.9 >51 mL/min   Anion gap 10 5 - 15  CBC with Differential/Platelet     Status: Abnormal   Collection Time: 01/04/21  9:07 PM  Result Value Ref Range   WBC 8.0 4.0 - 10.5 K/uL   RBC 3.58 (L) 3.87 - 5.11  MIL/uL   Hemoglobin 9.9 (L) 12.0 - 15.0 g/dL   HCT 92.9 (L) 24.4 - 62.8  %   MCV 86.9 80.0 - 100.0 fL   MCH 27.7 26.0 - 34.0 pg   MCHC 31.8 30.0 - 36.0 g/dL   RDW 63.8 17.7 - 11.6 %   Platelets 197 150 - 400 K/uL   nRBC 0.0 0.0 - 0.2 %   Neutrophils Relative % 77 %   Neutro Abs 6.2 1.7 - 7.7 K/uL   Lymphocytes Relative 14 %   Lymphs Abs 1.1 0.7 - 4.0 K/uL   Monocytes Relative 8 %   Monocytes Absolute 0.6 0.1 - 1.0 K/uL   Eosinophils Relative 0 %   Eosinophils Absolute 0.0 0.0 - 0.5 K/uL   Basophils Relative 0 %   Basophils Absolute 0.0 0.0 - 0.1 K/uL   Immature Granulocytes 1 %   Abs Immature Granulocytes 0.05 0.00 - 0.07 K/uL   No results found.  MDM Start IV LR Bolus Antiemetic Labs: CBC/D, BMP, UA, Covid Assessment and Plan  30 year old G2P0010  SIUP at 33.6 weeks Cat I FT Nausea/Vomiting Diarrhea  -POC Reviewed. -Exam performed and findings discussed. -Labs ordered and returns as above. -Reviewed with patient who questions next step in getting iron levels to near normal. -Instructed to talk with her primary ob regarding POC.  Discussed usage of iron pills. -Informed that labs show likely viral infection. -Will give IV fluids and possibly additional doses. -Discussed tocolytics if contractions unresponsive to fluid infusions.  -Questions addressed. -Report given to M.Mayford Knife, CNM who assumes care.   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   Aviva Signs, CNM

## 2021-01-04 NOTE — Telephone Encounter (Signed)
I spoke with the pt and she reported having diarrhea, body aches, abdominal cramps and fatigue, x1 day. Pt states that diarrhea is constant and nonstop occurring every 10-15 minutes. Pt informed me that she is unable to keep any food or liquids down for long. Pt stated that she has no fever. Pt also contacted her OBGYN but has yet to receive a call back from their office. I informed the pt to seek immediate medical attention ASAP. Pt agreed and stated she would go to the ED.

## 2021-01-04 NOTE — MAU Note (Addendum)
At 4mn I had bad stomach cramps. Today have had n/v/d. 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 Exposed to Noro Virus last wk at work. Good FM. Denies VB or LOF. Took Tylenol 500mg  tonight at 1900. Negative covid home test today

## 2021-01-05 ENCOUNTER — Telehealth: Payer: 59 | Admitting: Internal Medicine

## 2021-01-05 MED ORDER — NIFEDIPINE 10 MG PO CAPS
10.0000 mg | ORAL_CAPSULE | ORAL | Status: DC | PRN
  Administered 2021-01-05 (×2): 10 mg via ORAL
  Filled 2021-01-05 (×3): qty 1

## 2021-01-11 DIAGNOSIS — Z3A34 34 weeks gestation of pregnancy: Secondary | ICD-10-CM | POA: Diagnosis not present

## 2021-01-11 DIAGNOSIS — O26843 Uterine size-date discrepancy, third trimester: Secondary | ICD-10-CM | POA: Diagnosis not present

## 2021-01-12 ENCOUNTER — Other Ambulatory Visit: Payer: Self-pay

## 2021-01-12 ENCOUNTER — Non-Acute Institutional Stay (HOSPITAL_COMMUNITY)
Admission: RE | Admit: 2021-01-12 | Discharge: 2021-01-12 | Disposition: A | Discharge status: Home / Self Care | Payer: 59 | Source: Ambulatory Visit | Attending: Internal Medicine | Admitting: Internal Medicine

## 2021-01-12 DIAGNOSIS — O99013 Anemia complicating pregnancy, third trimester: Secondary | ICD-10-CM | POA: Insufficient documentation

## 2021-01-12 DIAGNOSIS — D509 Iron deficiency anemia, unspecified: Secondary | ICD-10-CM | POA: Diagnosis not present

## 2021-01-12 MED ORDER — DIPHENHYDRAMINE HCL 25 MG PO CAPS
25.0000 mg | ORAL_CAPSULE | Freq: Once | ORAL | Status: AC
  Administered 2021-01-12: 25 mg via ORAL
  Filled 2021-01-12: qty 1

## 2021-01-12 MED ORDER — SODIUM CHLORIDE 0.9 % IV SOLN
500.0000 mg | Freq: Once | INTRAVENOUS | Status: AC
  Administered 2021-01-12: 500 mg via INTRAVENOUS
  Filled 2021-01-12: qty 25

## 2021-01-12 MED ORDER — ACETAMINOPHEN 500 MG PO TABS
500.0000 mg | ORAL_TABLET | Freq: Once | ORAL | Status: AC
  Administered 2021-01-12: 500 mg via ORAL
  Filled 2021-01-12: qty 1

## 2021-01-12 MED ORDER — SODIUM CHLORIDE 0.9 % IV SOLN: INTRAVENOUS | Status: DC | PRN

## 2021-01-12 NOTE — Progress Notes (Addendum)
Patient received IV Venofer 500mg  ( dose 2 of 2) as ordered by MD. Pt premedicated with Tylenol and Benadryl per orders. 50 minutes prior to infusion finishing, pt  c/o 'burning and stinging ' in her legs bilaterally and 'feeling warm ' in her legs and face. Pt denies any other symptoms, denies SOB. Infusion stopped. Pt states this happened with previous iron infusion but did not occur until she was discharged, states the sensation in her legs lasted 1-2 days. Dr. Noland Fordyce RN Elpidio Eric at [facility] notified. Reviewed symptoms with a provider and instructed to discontinue the remainder of the infusion, pt did not receive remaining amount ( 57.22mL). VS stable. If pt requires further iron infusions, a different type of iron will be ordered. Per 4m RN pt can be discharged home, does not need to stay at the patient care center for further observation, and instructed pt to take tylenol and benadryl once home. Staff from [facility] to call patient later. Pt informed of these instructions, verbalized understanding. Prior to discharge, pt reports symptoms improving, and burning sensation is felt mostly in R leg only and has decreased in discomfort. Patient alert, oriented, and ambulatory at the time of discharge.

## 2021-01-26 DIAGNOSIS — O99019 Anemia complicating pregnancy, unspecified trimester: Secondary | ICD-10-CM | POA: Diagnosis not present

## 2021-01-26 DIAGNOSIS — O99013 Anemia complicating pregnancy, third trimester: Secondary | ICD-10-CM | POA: Diagnosis not present

## 2021-01-28 LAB — OB RESULTS CONSOLE GBS: GBS: NEGATIVE

## 2021-02-03 ENCOUNTER — Inpatient Hospital Stay (HOSPITAL_COMMUNITY): Payer: 59 | Admitting: Anesthesiology

## 2021-02-03 ENCOUNTER — Encounter (HOSPITAL_COMMUNITY): Payer: Self-pay | Admitting: Obstetrics and Gynecology

## 2021-02-03 ENCOUNTER — Inpatient Hospital Stay (HOSPITAL_COMMUNITY)
Admission: AD | Admit: 2021-02-03 | Discharge: 2021-02-05 | DRG: 806 | Disposition: A | Discharge status: Home / Self Care | Payer: 59 | Attending: Obstetrics and Gynecology | Admitting: Obstetrics and Gynecology

## 2021-02-03 ENCOUNTER — Other Ambulatory Visit: Payer: Self-pay

## 2021-02-03 DIAGNOSIS — K219 Gastro-esophageal reflux disease without esophagitis: Secondary | ICD-10-CM | POA: Diagnosis present

## 2021-02-03 DIAGNOSIS — D696 Thrombocytopenia, unspecified: Secondary | ICD-10-CM | POA: Diagnosis present

## 2021-02-03 DIAGNOSIS — O9912 Other diseases of the blood and blood-forming organs and certain disorders involving the immune mechanism complicating childbirth: Secondary | ICD-10-CM | POA: Diagnosis present

## 2021-02-03 DIAGNOSIS — O9902 Anemia complicating childbirth: Principal | ICD-10-CM | POA: Diagnosis present

## 2021-02-03 DIAGNOSIS — O9962 Diseases of the digestive system complicating childbirth: Secondary | ICD-10-CM | POA: Diagnosis present

## 2021-02-03 DIAGNOSIS — O26893 Other specified pregnancy related conditions, third trimester: Secondary | ICD-10-CM | POA: Diagnosis not present

## 2021-02-03 DIAGNOSIS — Z3A38 38 weeks gestation of pregnancy: Secondary | ICD-10-CM

## 2021-02-03 DIAGNOSIS — Z20822 Contact with and (suspected) exposure to covid-19: Secondary | ICD-10-CM | POA: Diagnosis present

## 2021-02-03 DIAGNOSIS — D509 Iron deficiency anemia, unspecified: Secondary | ICD-10-CM | POA: Diagnosis not present

## 2021-02-03 DIAGNOSIS — Z412 Encounter for routine and ritual male circumcision: Secondary | ICD-10-CM | POA: Diagnosis not present

## 2021-02-03 HISTORY — DX: Anemia, unspecified: D64.9

## 2021-02-03 LAB — RESP PANEL BY RT-PCR (FLU A&B, COVID) ARPGX2
Influenza A by PCR: NEGATIVE
Influenza B by PCR: NEGATIVE
SARS Coronavirus 2 by RT PCR: NEGATIVE

## 2021-02-03 LAB — CBC
HCT: 38.4 % (ref 36.0–46.0)
Hemoglobin: 12.2 g/dL (ref 12.0–15.0)
MCH: 28.6 pg (ref 26.0–34.0)
MCHC: 31.8 g/dL (ref 30.0–36.0)
MCV: 90.1 fL (ref 80.0–100.0)
Platelets: 171 10*3/uL (ref 150–400)
RBC: 4.26 MIL/uL (ref 3.87–5.11)
RDW: 18.9 % — ABNORMAL HIGH (ref 11.5–15.5)
WBC: 8 10*3/uL (ref 4.0–10.5)
nRBC: 0 % (ref 0.0–0.2)

## 2021-02-03 LAB — TYPE AND SCREEN
ABO/RH(D): O POS
Antibody Screen: NEGATIVE

## 2021-02-03 LAB — RPR: RPR Ser Ql: NONREACTIVE

## 2021-02-03 MED ORDER — PANTOPRAZOLE SODIUM 40 MG PO TBEC
40.0000 mg | DELAYED_RELEASE_TABLET | Freq: Every day | ORAL | Status: DC
  Administered 2021-02-04 – 2021-02-05 (×2): 40 mg via ORAL
  Filled 2021-02-03 (×2): qty 1

## 2021-02-03 MED ORDER — ACETAMINOPHEN 325 MG PO TABS
650.0000 mg | ORAL_TABLET | ORAL | Status: DC | PRN
  Administered 2021-02-03 – 2021-02-04 (×3): 650 mg via ORAL
  Filled 2021-02-03 (×3): qty 2

## 2021-02-03 MED ORDER — EPHEDRINE 5 MG/ML INJ: 10.0000 mg | INTRAVENOUS | Status: DC | PRN

## 2021-02-03 MED ORDER — IBUPROFEN 600 MG PO TABS
600.0000 mg | ORAL_TABLET | Freq: Four times a day (QID) | ORAL | Status: DC
  Administered 2021-02-03 – 2021-02-05 (×6): 600 mg via ORAL
  Filled 2021-02-03 (×6): qty 1

## 2021-02-03 MED ORDER — ONDANSETRON HCL 4 MG/2ML IJ SOLN: 4.0000 mg | INTRAMUSCULAR | Status: DC | PRN

## 2021-02-03 MED ORDER — OXYCODONE-ACETAMINOPHEN 5-325 MG PO TABS: 2.0000 | ORAL_TABLET | ORAL | Status: DC | PRN

## 2021-02-03 MED ORDER — ONDANSETRON HCL 4 MG/2ML IJ SOLN: 4.0000 mg | Freq: Four times a day (QID) | INTRAMUSCULAR | Status: DC | PRN

## 2021-02-03 MED ORDER — DIBUCAINE (PERIANAL) 1 % EX OINT: 1.0000 "application " | TOPICAL_OINTMENT | CUTANEOUS | Status: DC | PRN

## 2021-02-03 MED ORDER — LIDOCAINE HCL (PF) 1 % IJ SOLN
30.0000 mL | INTRAMUSCULAR | Status: AC | PRN
  Administered 2021-02-03: 30 mL via SUBCUTANEOUS
  Filled 2021-02-03: qty 30

## 2021-02-03 MED ORDER — SIMETHICONE 80 MG PO CHEW: 80.0000 mg | CHEWABLE_TABLET | ORAL | Status: DC | PRN

## 2021-02-03 MED ORDER — ZOLPIDEM TARTRATE 5 MG PO TABS: 5.0000 mg | ORAL_TABLET | Freq: Every evening | ORAL | Status: DC | PRN

## 2021-02-03 MED ORDER — DIPHENHYDRAMINE HCL 25 MG PO CAPS: 25.0000 mg | ORAL_CAPSULE | Freq: Four times a day (QID) | ORAL | Status: DC | PRN

## 2021-02-03 MED ORDER — LACTATED RINGERS IV SOLN: INTRAVENOUS | Status: DC

## 2021-02-03 MED ORDER — OXYTOCIN-SODIUM CHLORIDE 30-0.9 UT/500ML-% IV SOLN
2.5000 [IU]/h | INTRAVENOUS | Status: DC
  Administered 2021-02-03: 2.5 [IU]/h via INTRAVENOUS
  Filled 2021-02-03: qty 500

## 2021-02-03 MED ORDER — BENZOCAINE-MENTHOL 20-0.5 % EX AERO
1.0000 "application " | INHALATION_SPRAY | CUTANEOUS | Status: DC | PRN
  Administered 2021-02-03: 1 via TOPICAL
  Filled 2021-02-03: qty 56

## 2021-02-03 MED ORDER — WITCH HAZEL-GLYCERIN EX PADS: 1.0000 "application " | MEDICATED_PAD | CUTANEOUS | Status: DC | PRN

## 2021-02-03 MED ORDER — SOD CITRATE-CITRIC ACID 500-334 MG/5ML PO SOLN: 30.0000 mL | ORAL | Status: DC | PRN

## 2021-02-03 MED ORDER — ONDANSETRON HCL 4 MG PO TABS: 4.0000 mg | ORAL_TABLET | ORAL | Status: DC | PRN

## 2021-02-03 MED ORDER — SENNOSIDES-DOCUSATE SODIUM 8.6-50 MG PO TABS
2.0000 | ORAL_TABLET | Freq: Every day | ORAL | Status: DC
  Administered 2021-02-04 – 2021-02-05 (×2): 2 via ORAL
  Filled 2021-02-03 (×2): qty 2

## 2021-02-03 MED ORDER — PHENYLEPHRINE 40 MCG/ML (10ML) SYRINGE FOR IV PUSH (FOR BLOOD PRESSURE SUPPORT)
80.0000 ug | PREFILLED_SYRINGE | INTRAVENOUS | Status: DC | PRN
  Filled 2021-02-03: qty 10

## 2021-02-03 MED ORDER — PRENATAL MULTIVITAMIN CH
1.0000 | ORAL_TABLET | Freq: Every day | ORAL | Status: DC
  Administered 2021-02-03 – 2021-02-05 (×3): 1 via ORAL
  Filled 2021-02-03 (×3): qty 1

## 2021-02-03 MED ORDER — TETANUS-DIPHTH-ACELL PERTUSSIS 5-2.5-18.5 LF-MCG/0.5 IM SUSY: 0.5000 mL | PREFILLED_SYRINGE | Freq: Once | INTRAMUSCULAR | Status: DC

## 2021-02-03 MED ORDER — ACETAMINOPHEN 325 MG PO TABS: 650.0000 mg | ORAL_TABLET | ORAL | Status: DC | PRN

## 2021-02-03 MED ORDER — COCONUT OIL OIL
1.0000 "application " | TOPICAL_OIL | Status: DC | PRN
  Administered 2021-02-03: 1 via TOPICAL

## 2021-02-03 MED ORDER — DIPHENHYDRAMINE HCL 50 MG/ML IJ SOLN: 12.5000 mg | INTRAMUSCULAR | Status: DC | PRN

## 2021-02-03 MED ORDER — FLEET ENEMA 7-19 GM/118ML RE ENEM: 1.0000 | ENEMA | RECTAL | Status: DC | PRN

## 2021-02-03 MED ORDER — FENTANYL-BUPIVACAINE-NACL 0.5-0.125-0.9 MG/250ML-% EP SOLN
12.0000 mL/h | EPIDURAL | Status: DC | PRN
  Administered 2021-02-03: 11 mL/h via EPIDURAL
  Filled 2021-02-03: qty 250

## 2021-02-03 MED ORDER — LIDOCAINE HCL (PF) 1 % IJ SOLN
INTRAMUSCULAR | Status: DC | PRN
  Administered 2021-02-03: 4 mL via EPIDURAL
  Administered 2021-02-03: 5 mL via EPIDURAL

## 2021-02-03 MED ORDER — OXYCODONE-ACETAMINOPHEN 5-325 MG PO TABS: 1.0000 | ORAL_TABLET | ORAL | Status: DC | PRN

## 2021-02-03 MED ORDER — OXYTOCIN BOLUS FROM INFUSION
333.0000 mL | Freq: Once | INTRAVENOUS | Status: AC
  Administered 2021-02-03: 333 mL via INTRAVENOUS

## 2021-02-03 MED ORDER — PHENYLEPHRINE 40 MCG/ML (10ML) SYRINGE FOR IV PUSH (FOR BLOOD PRESSURE SUPPORT): 80.0000 ug | PREFILLED_SYRINGE | INTRAVENOUS | Status: DC | PRN

## 2021-02-03 MED ORDER — LACTATED RINGERS IV SOLN: 500.0000 mL | Freq: Once | INTRAVENOUS | Status: DC

## 2021-02-03 MED ORDER — LACTATED RINGERS IV SOLN: 500.0000 mL | INTRAVENOUS | Status: DC | PRN

## 2021-02-03 NOTE — Plan of Care (Signed)

## 2021-02-03 NOTE — Anesthesia Procedure Notes (Signed)
Epidural Patient location during procedure: OB Start time: 02/03/2021 7:31 AM End time: 02/03/2021 7:40 AM  Preanesthetic Checklist Completed: patient identified, IV checked, site marked, risks and benefits discussed, surgical consent, monitors and equipment checked, pre-op evaluation and timeout performed  Epidural Patient position: sitting Prep: DuraPrep and site prepped and draped Patient monitoring: continuous pulse ox and blood pressure Approach: midline Location: L3-L4 Injection technique: LOR air  Needle:  Needle type: Tuohy  Needle gauge: 17 G Needle length: 9 cm and 9 Needle insertion depth: 4 cm Catheter type: closed end flexible Catheter size: 19 Gauge Catheter at skin depth: 9 cm Test dose: negative and Other  Assessment Events: blood not aspirated, injection not painful, no injection resistance, no paresthesia and negative IV test  Additional Notes Patient identified. Risks and benefits discussed including failed block, incomplete  Pain control, post dural puncture headache, nerve damage, paralysis, blood pressure Changes, nausea, vomiting, reactions to medications-both toxic and allergic and post Partum back pain. All questions were answered. Patient expressed understanding and wished to proceed. Sterile technique was used throughout procedure. Epidural site was Dressed with sterile barrier dressing. No paresthesias, signs of intravascular injection Or signs of intrathecal spread were encountered.  Patient was more comfortable after the epidural was dosed. Please see RN's note for documentation of vital signs and FHR which are stable. Reason for block:procedure for pain

## 2021-02-03 NOTE — Anesthesia Postprocedure Evaluation (Signed)
Anesthesia Post Note  Patient: Michele Price  Procedure(s) Performed: AN AD HOC LABOR EPIDURAL     Patient location during evaluation: Mother Baby Anesthesia Type: Epidural Level of consciousness: awake Pain management: satisfactory to patient Vital Signs Assessment: post-procedure vital signs reviewed and stable Respiratory status: spontaneous breathing Cardiovascular status: stable Anesthetic complications: no   No notable events documented.  Last Vitals:  Vitals:   02/03/21 1134 02/03/21 1258  BP: 125/68 113/79  Pulse: 68 84  Resp: 20   Temp: 36.6 C 36.8 C  SpO2:      Last Pain:  Vitals:   02/03/21 1430  TempSrc:   PainSc: 0-No pain   Pain Goal:                   [employer]

## 2021-02-03 NOTE — Lactation Note (Signed)
This note was copied from a baby's chart. Lactation Consultation Note  Patient Name: Michele Price Date: 02/03/2021 Reason for consult: L&D Initial assessment;Early term 37-38.6wks;Primapara Age:30 hours  Education was done with Mom; she cited being worried that she didn't have any colostrum or "let down." Mom has a hx of  Hyperprolactinemia that was treated with bromocriptine 4-5 yrs ago. I explained that the bromocriptine usage from so long ago would not impact her milk supply.   "[name]" latched with relative ease. Some swallows noted with cervical auscultation. Infant's tongue noted to move downward with attempts at extension.   Lactation to f/u later today on postpartum floor.   [name] [facility] 02/03/2021, 10:36 AM

## 2021-02-03 NOTE — Anesthesia Preprocedure Evaluation (Signed)
Anesthesia Evaluation  Patient identified by MRN, date of birth, ID band Patient awake    Reviewed: Allergy & Precautions, Patient's Chart, lab work & pertinent test results  Airway Mallampati: II       Dental no notable dental hx.    Pulmonary neg pulmonary ROS,    Pulmonary exam normal        Cardiovascular negative cardio ROS Normal cardiovascular exam Rhythm:Regular     Neuro/Psych negative neurological ROS  negative psych ROS   GI/Hepatic Neg liver ROS, GERD  Medicated,  Endo/Other  Hx/o hyperprolactinemia  Renal/GU negative Renal ROS  negative genitourinary   Musculoskeletal negative musculoskeletal ROS (+)   Abdominal   Peds  Hematology  (+) anemia ,   Anesthesia Other Findings   Reproductive/Obstetrics (+) Pregnancy                             Anesthesia Physical Anesthesia Plan  ASA: 2  Anesthesia Plan: Epidural   Post-op Pain Management:    Induction:   PONV Risk Score and Plan:   Airway Management Planned: Natural Airway  Additional Equipment:   Intra-op Plan:   Post-operative Plan:   Informed Consent: I have reviewed the patients History and Physical, chart, labs and discussed the procedure including the risks, benefits and alternatives for the proposed anesthesia with the patient or authorized representative who has indicated his/her understanding and acceptance.       Plan Discussed with: Anesthesiologist  Anesthesia Plan Comments:         Anesthesia Quick Evaluation

## 2021-02-03 NOTE — Lactation Note (Signed)
This note was copied from a baby's chart. 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  LC in to visit with P1 Mom of ET infant.   Baby asleep swaddled in crib. Mom reports baby has been feeding "a lot" and she has a bleeding left nipple.  Both nipples erect and no visible trauma.  Mom was planning to use lanolin, but recommended coconut oil and RN aware.  Mom eating snacks currently.  Reviewed breast massage and hand expression, no colostrum expressed currently.  Encouraged STS with baby when rested.  Recommended Mom call for assistance/assessment of baby on the breast when he cues again.   Mom aware of IP and OP lactation support.    Interventions Interventions: Breast feeding basics reviewed;Skin to skin;Breast massage;Hand express;Coconut oil;LC Services brochure Consult Status Consult Status: Follow-up Date: 02/04/21 Follow-up type: In-patient    Michele Price 02/03/2021, 3:17 PM

## 2021-02-03 NOTE — H&P (Signed)
Michele Price is a 30 y.o. female G2P0010 [redacted]w[redacted]d presenting for labor. Patient reports contractions started around midnight and have progressively gotten stronger since then. Notes some bloody show but no LOF. Good FM.  Routine prenatal care at [facility] with Dr. Ernestina Penna as primary OB provider Pregnancy dated LMP c/w 8w sono. Umbilical cord cyst noted at CP sono. She had a low risk quad screen and normal anatomy US. Normal prenatal labs. Pregnancy complicated by hyperemesis resulting in minimal weight gain. Taking phenergan PRN and protonix daily. Had growth US done at 34.6 for S>D despite low weight gain, EFW 5#7 (39%) with anterior placenta normal fluid and vertex presentation noted. Also followed for IDA and received IV iron transfusion x2 in the pregnancy with the 2nd one stopped early due to flushing, has been on oral iron daily since with improvement of Hgb to 11.6 and mild thrombocytopenia with platelets of 145 on 11/16. Normal 1hr GTT 96 and GBS neg.  Patient found to make cervical change in MAU from 2 to 4 cm and requested epidural for pain relief.  Patient is an [employer] fundraiser. She is accompanied by her mom and fiance for labor support. They are expecting a baby boy.  OB History     Gravida  2   Para      Term      Preterm      AB  1   Living         SAB  1   IAB      Ectopic      Multiple      Live Births             Past Medical History:  Diagnosis Date   Anemia    GERD (gastroesophageal reflux disease)    Hyperprolactinemia (HCC)    Palpitation    Tachycardia    Past Surgical History:  Procedure Laterality Date   NO PAST SURGERIES     Family History: family history includes Breast cancer in her cousin, maternal aunt, and maternal grandmother; Hypertension in her brother, brother, father, and mother. Social History:  reports that she has never smoked. She has never used smokeless tobacco. She reports that she does not drink alcohol and does not use drugs.      Maternal Diabetes: No Genetic Screening: Normal Maternal Ultrasounds/Referrals: Normal Fetal Ultrasounds or other Referrals:  None Maternal Substance Abuse:  No Significant Maternal Medications:  Meds include: Protonix Significant Maternal Lab Results:  Group B Strep negative Other Comments:  None  Review of Systems  All other systems reviewed and are negative. Per HPI Maternal Exam:  Abdomen: Estimated fetal weight is 7#.   Fetal presentation: vertex Introitus: Normal vulva. Normal vagina.  Amniotic fluid character: clear. Pelvis: adequate for delivery.   Cervix: Cervix evaluated by digital exam.     Fetal Exam Fetal Monitor Review: Baseline rate: 135.  Variability: moderate (6-25 bpm).   Pattern: accelerations present and no decelerations.   Fetal State Assessment: Category I - tracings are normal.  Physical Exam Vitals reviewed.  Constitutional:      Appearance: Normal appearance.  HENT:     Head: Normocephalic.  Cardiovascular:     Rate and Rhythm: Normal rate.  Pulmonary:     Effort: Pulmonary effort is normal.  Abdominal:     Tenderness: There is no abdominal tenderness.  Genitourinary:    General: Normal vulva.  Musculoskeletal:     Cervical back: Normal range of motion.  Skin:  General: Skin is warm and dry.  Neurological:     General: No focal deficit present.     Mental Status: She is alert and oriented to person, place, and time.  Psychiatric:        Mood and Affect: Mood normal.        Behavior: Behavior normal.    Dilation: 6 Effacement (%): 80 Station: 0, Plus 1 Exam by:: Manford Sprong Blood pressure 123/80, pulse 82, temperature 98 F (36.7 C), temperature source Oral, resp. rate 16, height 5\' 2"  (1.575 m), weight 63 kg, SpO2 99 %.  Prenatal labs: ABO, Rh:  --/--/O POS (11/24 05-03-1977) Antibody: NEG (11/24 0528) Rubella: Immune (05/12 0000) RPR: Nonreactive (05/12 0000)  HBsAg: Negative (09/13 0000)  HIV: Non-reactive (05/12 0000)  GBS:  Negative/-- (11/18 0000)   ChemistryNo results for input(s): NA, K, CL, CO2, GLUCOSE, BUN, CREATININE, CALCIUM, GFRNONAA, GFRAA, ANIONGAP in the last 168 hours.  No results for input(s): PROT, ALBUMIN, AST, ALT, ALKPHOS, BILITOT in the last 168 hours. Hematology Recent Labs  Lab 02/03/21 0519  WBC 8.0  RBC 4.26  HGB 12.2  HCT 38.4  MCV 90.1  MCH 28.6  MCHC 31.8  RDW 18.9*  PLT 171   Cardiac EnzymesNo results for input(s): TROPONINI in the last 168 hours. No results for input(s): TROPIPOC in the last 168 hours.  BNPNo results for input(s): BNP, PROBNP in the last 168 hours.  DDimer No results for input(s): DDIMER in the last 168 hours.   Assessment/Plan: [name] is a 30 y.o. female G2P0010 [redacted]w[redacted]d admitted with active labor  -Admission to LD -Routine admission labs -Epidural labor pain mgmt, pt comfortable -Labor: pt progressing well now 6cm with own ctx pattern although spaced. Consented for AROM- ruptured for clear fluid. Will cont expectant mgmt and discussed addition of Pitocin for augmentation if needed -Continuous EFM/Toco- CAT I -GBS NEG -chronic IDA s/p antenatal IV iron transfusions, normal Hgb 12.2 on admission, plan for PPD1 CBC -mild thrombocytopenia noted at 36w, stable plts on admission 171 -RH POS< Rub. Imm -Routine intrapartum care -Anticipate SVD  Naketa Daddario A Gillian Meeuwsen 02/03/2021, 8:48 AM

## 2021-02-04 LAB — CBC
HCT: 33.6 % — ABNORMAL LOW (ref 36.0–46.0)
Hemoglobin: 10.7 g/dL — ABNORMAL LOW (ref 12.0–15.0)
MCH: 28.9 pg (ref 26.0–34.0)
MCHC: 31.8 g/dL (ref 30.0–36.0)
MCV: 90.8 fL (ref 80.0–100.0)
Platelets: 135 10*3/uL — ABNORMAL LOW (ref 150–400)
RBC: 3.7 MIL/uL — ABNORMAL LOW (ref 3.87–5.11)
RDW: 19.6 % — ABNORMAL HIGH (ref 11.5–15.5)
WBC: 11.3 10*3/uL — ABNORMAL HIGH (ref 4.0–10.5)
nRBC: 0 % (ref 0.0–0.2)

## 2021-02-04 NOTE — Progress Notes (Signed)
    PPD # 1 S/P NSVD  Live born female  Birth Weight: 6 lb 14.8 oz (3140 g) APGAR: 9, 9  Newborn Delivery   Birth date/time: 02/03/2021 09:39:00 Delivery type: Vaginal, Spontaneous     Baby name: Duwayne Heck Delivering provider: LAW, CASSANDRA A  Episiotomy:None   Lacerations:2nd degree   circumcision completed  Feeding: breast  Pain control at delivery: Epidural   S:  Reports feeling well.             Tolerating po/ No nausea or vomiting             Bleeding is decreasing             Pain controlled with acetaminophen and ibuprofen (OTC)             Up ad lib / ambulatory / voiding without difficulties   O:  A & O x 3, in no apparent distress              VS:  Vitals:   02/03/21 1430 02/03/21 1833 02/03/21 2308 02/04/21 0500  BP: 116/77 119/74 120/81 114/78  Pulse: 80 64 71 81  Resp: 20 18 16    Temp: 98 F (36.7 C) 97.6 F (36.4 C) 98.1 F (36.7 C)   TempSrc:   Oral   SpO2:   98% 99%  Weight:      Height:        LABS:  Recent Labs    02/03/21 0519 02/04/21 0534  WBC 8.0 11.3*  HGB 12.2 10.7*  HCT 38.4 33.6*  PLT 171 135*    Blood type: --/--/O POS (11/24 0528)  Rubella: Immune (05/12 0000)   I&O: I/O last 3 completed shifts: In: 0  Out: 75 [Blood:75]          No intake/output data recorded.  Vaccines: TDaP          UTD         Flu             UTD                    COVID-19 UTD  Gen: AAO x 3, NAD  Abdomen: soft, non-tender, non-distended             Fundus: firm, non-tender, U-1  Perineum: intact repair, mild edema  Lochia: small  Extremities: no edema, no calf pain or tenderness    A/P: PPD # 1 29 y.o., 06-08-1990   Principal Problem:   Postpartum care following vaginal delivery 11/24 Active Problems:   SVD (spontaneous vaginal delivery)   Perineal laceration, second degree   Doing well - stable status  Routine post partum orders  Anticipate discharge tomorrow    12/24, MSN, CNM 02/04/2021, 8:36 AM

## 2021-02-05 ENCOUNTER — Ambulatory Visit: Payer: Self-pay

## 2021-02-05 MED ORDER — IBUPROFEN 600 MG PO TABS: 600.0000 mg | ORAL_TABLET | Freq: Four times a day (QID) | ORAL | 0 refills | Status: DC

## 2021-02-05 NOTE — Discharge Summary (Signed)
Postpartum Discharge Summary    Patient Name: Michele Price DOB: 08-11-1990 MRN: [id number]  Date of admission: 02/03/2021 Delivery date:02/03/2021  Delivering provider: Langley Gauss A  Date of discharge: 02/05/2021  Admitting diagnosis: Normal labor and delivery [O80] Intrauterine pregnancy: [redacted]w[redacted]d    Secondary diagnosis:  Principal Problem:   Postpartum care following vaginal delivery 11/24 Active Problems:   SVD (spontaneous vaginal delivery)   Perineal laceration, second degree    Discharge diagnosis: Term Pregnancy Delivered                                              Post partum procedures: none Augmentation: N/A Complications: None  Hospital course: Onset of Labor With Vaginal Delivery      30y.o. yo G2P1011 at 362w1das admitted in Active Labor on 02/03/2021. Patient had an uncomplicated labor course as follows:  Membrane Rupture Time/Date: 8:29 AM ,02/03/2021   Delivery Method:Vaginal, Spontaneous  Episiotomy: None  Lacerations:  2nd degree  Patient had an uncomplicated postpartum course.  She is ambulating, tolerating a regular diet, passing flatus, and urinating well. Patient is discharged home in stable condition on 02/05/21.  Newborn Data: Birth date:02/03/2021  Birth time:9:39 AM  Gender:Female  Living status:Living  Apgars:9 ,9  Weight:3140 g   Magnesium Sulfate received: No BMZ received: No Rhophylac:N/A MMR:N/A T-DaP:Given prenatally Flu: Yes prenatally  Transfusion:No  Physical exam  Vitals:   02/04/21 0500 02/04/21 2040 02/05/21 0500 02/05/21 0920  BP: 114/78 112/70 116/68 123/75  Pulse: 81 80 80 74  Resp:  16 16   Temp:  98 F (36.7 C) 98.1 F (36.7 C)   TempSrc:  Oral Oral   SpO2: 99% 99% 100% 99%  Weight:      Height:       General: alert Lochia: appropriate Uterine Fundus: firm Incision: Healing well with no significant drainage, No significant erythema DVT Evaluation: No evidence of DVT seen on physical  exam. Labs: Lab Results  Component Value Date   WBC 11.3 (H) 02/04/2021   HGB 10.7 (L) 02/04/2021   HCT 33.6 (L) 02/04/2021   MCV 90.8 02/04/2021   PLT 135 (L) 02/04/2021   CMP Latest Ref Rng & Units 01/04/2021  Glucose 70 - 99 mg/dL 81  BUN 6 - 20 mg/dL 10  Creatinine 0.44 - 1.00 mg/dL 0.62  Sodium 135 - 145 mmol/L 134(L)  Potassium 3.5 - 5.1 mmol/L 3.7  Chloride 98 - 111 mmol/L 106  CO2 22 - 32 mmol/L 18(L)  Calcium 8.9 - 10.3 mg/dL 8.9  Total Protein 6.0 - 8.3 g/dL -  Total Bilirubin 0.3 - 1.2 mg/dL -  Alkaline Phos 39 - 117 U/L -  AST 0 - 37 U/L -  ALT 0 - 35 U/L -   Edinburgh Score: Edinburgh Postnatal Depression Scale Screening Tool 02/05/2021  I have been able to laugh and see the funny side of things. 0  I have looked forward with enjoyment to things. 0  I have blamed myself unnecessarily when things went wrong. 0  I have been anxious or worried for no good reason. 0  I have felt scared or panicky for no good reason. 0  Things have been getting on top of me. 0  I have been so unhappy that I have had difficulty sleeping. 0  I have felt sad or miserable.  0  I have been so unhappy that I have been crying. 0  The thought of harming myself has occurred to me. 0  Edinburgh Postnatal Depression Scale Total 0     After visit meds:  Allergies as of 02/05/2021       Reactions   Banana Anaphylaxis, Shortness Of Breath   Latex Rash, Swelling, Dermatitis   Blisters   Penicillins Hives, Dermatitis   hives   Pineapple Anaphylaxis, Shortness Of Breath   Vancomycin Anaphylaxis   REACTION: red man syndrome per patient   Doxycycline Hyclate    REACTION: nausea , dizzines   Eggs Or Egg-derived Products Dermatitis   Hives        Medication List     STOP taking these medications    folic acid 1 MG tablet Commonly known as: FOLVITE   promethazine 25 MG tablet Commonly known as: PHENERGAN       TAKE these medications    acetaminophen 500 MG  tablet Commonly known as: TYLENOL Take 500 mg by mouth every 6 (six) hours as needed.   famotidine 20 MG tablet Commonly known as: PEPCID Take 20 mg by mouth 2 (two) times daily.   ibuprofen 600 MG tablet Commonly known as: ADVIL Take 1 tablet (600 mg total) by mouth every 6 (six) hours.   pantoprazole 40 MG tablet Commonly known as: PROTONIX Take 40 mg by mouth daily.   PRENATAL 1 PO Take by mouth.         Discharge home in stable condition Infant Feeding: Breast and formula since weight loss  Infant Disposition:home with mother Discharge instruction: per After Visit Summary and Postpartum booklet. Activity: Advance as tolerated. Pelvic rest for 6 weeks.  Diet: routine diet Future Appointments:No future appointments. Follow up Visit: call to see Dr Pamala Hurry in 6 weeks    Please schedule this patient for a In person postpartum visit in 6 weeks with the following provider: MD. Delivery mode:  Vaginal, Spontaneous  Anticipated Birth Control:   TBD in office    02/05/2021 Elveria Royals, MD

## 2021-02-05 NOTE — Progress Notes (Signed)
PPD #2  Live born female  Birth Weight: 6 lb 14.8 oz (3140 g) APGAR: 9, 9 S/p circumcision  S:  Reports feeling well.             Tolerating po/ No nausea or vomiting             Bleeding is decreasing             Pain controlled with acetaminophen and ibuprofen (OTC)             Up ad lib / ambulatory / voiding without difficulties   O:  A & O x 3, in no apparent distress              VS:  Vitals:   02/04/21 0500 02/04/21 2040 02/05/21 0500 02/05/21 0920  BP: 114/78 112/70 116/68 123/75  Pulse: 81 80 80 74  Resp:  16 16   Temp:  98 F (36.7 C) 98.1 F (36.7 C)   TempSrc:  Oral Oral   SpO2: 99% 99% 100% 99%  Weight:      Height:        LABS:  Recent Labs    02/03/21 0519 02/04/21 0534  WBC 8.0 11.3*  HGB 12.2 10.7*  HCT 38.4 33.6*  PLT 171 135*     Blood type: --/--/O POS (11/24 6063)  Rubella: Immune (05/12 0000)    Vaccines: TDaP          UTD         Flu             UTD                    COVID-19 UTD  Gen: AAO x 3, NAD  Abdomen: soft, non-tender, non-distended             Fundus: firm, non-tender, U-1  Perineum: intact repair, mild edema  Lochia: small  Extremities: no edema, no calf pain or tenderness    A/P: PPD # 2 30 y.o., [id number]   Principal Problem:   Postpartum care following vaginal delivery 11/24 Active Problems:   SVD (spontaneous vaginal delivery)   Perineal laceration, second degree  Discharge home. PP care and warning s/s including depression d/w pt     Robley Fries, MD 02/05/2021, 12:05 PM

## 2021-02-05 NOTE — Lactation Note (Signed)
This note was copied from a baby's chart. Lactation Consultation Note  Patient Name: Michele Price VOJJK'[id number] Date: 02/05/2021   Age:30 hours Mother has bilateral cracked nipples. She was advised to pump and take a rest from breastfeeding. Mother has comfort gels and coconut oil. She was advised to use exp breastmilk and air dry  as much as possible.  Mother wanted to be shown how to do cross cradle hold.  Infant placed in position, but was very sleepy and showing no feeding cues.  Advised mother to hand express then pump for 15 min. Mother has a Spectra pump at home.  Discussed treatment and prevention of engorgement.  Mother would like a follow up visit with LC . Scheduled appt.   Maternal Data    Feeding    LATCH Score                    Lactation Tools Discussed/Used    Interventions    Discharge    Consult Status      Michel Bickers 02/05/2021, 3:29 PM

## 2021-02-15 DIAGNOSIS — R69 Illness, unspecified: Secondary | ICD-10-CM | POA: Diagnosis not present

## 2021-02-16 ENCOUNTER — Telehealth (HOSPITAL_COMMUNITY): Payer: Self-pay | Admitting: *Deleted

## 2021-02-16 NOTE — Telephone Encounter (Signed)
Patient reported that her anxiety is high. Shared with this RN that she contacted Dr. Ernestina Penna on 12/5 because of increased anxiety, insomnia, and episodes of "lashing out." Reported that Dr. Ernestina Penna prescribed Lexapro and Buspar; however patient has been unable to get medications from the pharmacy yet. Patient stated that home visit nurse completed the EPDS with her on 12/5. Patient unaware of her score, but stated, "It was high." Patient agreed to complete EPDS with this RN - score today =21. Fax sent to Dr. Ernestina Penna with results. Patient requested RN email information on maternal mental health resources, hospital's virtual Baby and Me class, and on the hospital's virtual breastfeeding and pumping support groups. Email sent. Patient voiced no other concerns regarding her own health at this time.  Patient reported that infant has a pediatric appointment scheduled for tomorrow. Patient is breastfeeding, pumping, and supplementing with formula. Patient reported that infant frequently chokes when bottle-feeding, and also chokes while lying down. Appointment scheduled for tomorrow to evaluate patient's concerns. Patient stated that she is burping infant frequently through the bottle-feedings and holding infant upright after feeds.RN instructed patient to call 911 if infant shows color changes and/or stops breathing. Patient verbalized understanding. Stated, "But this is why I can't sleep. I'm worried about if he's choking." Additionally, patient has an outpatient lactation appointment scheduled for 12/14. RN to also email lactation warmline number. Patient reported infant sleeps in a soft-side co-sleeper on his back or side. RN reviewed ABCs of safe sleep with patient. Patient verbalized understanding. No other questions or concerns voiced at this time. Deforest Hoyles, RN, 02/16/21, [phone]

## 2021-11-02 DIAGNOSIS — Z3202 Encounter for pregnancy test, result negative: Secondary | ICD-10-CM | POA: Diagnosis not present

## 2021-11-02 DIAGNOSIS — G43909 Migraine, unspecified, not intractable, without status migrainosus: Secondary | ICD-10-CM | POA: Diagnosis not present

## 2021-11-09 DIAGNOSIS — Z8669 Personal history of other diseases of the nervous system and sense organs: Secondary | ICD-10-CM | POA: Diagnosis not present

## 2021-11-09 DIAGNOSIS — F418 Other specified anxiety disorders: Secondary | ICD-10-CM | POA: Diagnosis not present

## 2021-12-15 DIAGNOSIS — R051 Acute cough: Secondary | ICD-10-CM | POA: Diagnosis not present

## 2021-12-15 DIAGNOSIS — R52 Pain, unspecified: Secondary | ICD-10-CM | POA: Diagnosis not present

## 2021-12-15 DIAGNOSIS — J069 Acute upper respiratory infection, unspecified: Secondary | ICD-10-CM | POA: Diagnosis not present

## 2021-12-15 DIAGNOSIS — Z03818 Encounter for observation for suspected exposure to other biological agents ruled out: Secondary | ICD-10-CM | POA: Diagnosis not present

## 2022-01-18 DIAGNOSIS — Z13220 Encounter for screening for lipoid disorders: Secondary | ICD-10-CM | POA: Diagnosis not present

## 2022-01-18 DIAGNOSIS — Z8349 Family history of other endocrine, nutritional and metabolic diseases: Secondary | ICD-10-CM | POA: Diagnosis not present

## 2022-01-18 DIAGNOSIS — Z862 Personal history of diseases of the blood and blood-forming organs and certain disorders involving the immune mechanism: Secondary | ICD-10-CM | POA: Diagnosis not present

## 2022-01-25 DIAGNOSIS — F418 Other specified anxiety disorders: Secondary | ICD-10-CM | POA: Diagnosis not present

## 2022-06-06 DIAGNOSIS — Z111 Encounter for screening for respiratory tuberculosis: Secondary | ICD-10-CM | POA: Diagnosis not present

## 2023-01-11 DIAGNOSIS — Z1329 Encounter for screening for other suspected endocrine disorder: Secondary | ICD-10-CM | POA: Diagnosis not present

## 2023-01-11 DIAGNOSIS — Z131 Encounter for screening for diabetes mellitus: Secondary | ICD-10-CM | POA: Diagnosis not present

## 2023-01-11 DIAGNOSIS — Z13220 Encounter for screening for lipoid disorders: Secondary | ICD-10-CM | POA: Diagnosis not present

## 2023-01-11 DIAGNOSIS — Z803 Family history of malignant neoplasm of breast: Secondary | ICD-10-CM | POA: Diagnosis not present

## 2023-01-11 DIAGNOSIS — Z01419 Encounter for gynecological examination (general) (routine) without abnormal findings: Secondary | ICD-10-CM | POA: Diagnosis not present

## 2023-01-11 DIAGNOSIS — Z1331 Encounter for screening for depression: Secondary | ICD-10-CM | POA: Diagnosis not present

## 2023-01-11 DIAGNOSIS — Z0000 Encounter for general adult medical examination without abnormal findings: Secondary | ICD-10-CM | POA: Diagnosis not present

## 2023-03-22 DIAGNOSIS — Z8041 Family history of malignant neoplasm of ovary: Secondary | ICD-10-CM | POA: Diagnosis not present

## 2023-03-22 DIAGNOSIS — Z803 Family history of malignant neoplasm of breast: Secondary | ICD-10-CM | POA: Diagnosis not present

## 2023-04-10 DIAGNOSIS — Z803 Family history of malignant neoplasm of breast: Secondary | ICD-10-CM | POA: Diagnosis not present

## 2023-04-10 DIAGNOSIS — Z8041 Family history of malignant neoplasm of ovary: Secondary | ICD-10-CM | POA: Diagnosis not present

## 2023-05-02 DIAGNOSIS — F411 Generalized anxiety disorder: Secondary | ICD-10-CM | POA: Diagnosis not present

## 2023-05-09 DIAGNOSIS — Z803 Family history of malignant neoplasm of breast: Secondary | ICD-10-CM | POA: Diagnosis not present

## 2023-05-09 DIAGNOSIS — Z1239 Encounter for other screening for malignant neoplasm of breast: Secondary | ICD-10-CM | POA: Diagnosis not present

## 2023-05-16 DIAGNOSIS — F411 Generalized anxiety disorder: Secondary | ICD-10-CM | POA: Diagnosis not present

## 2023-05-28 DIAGNOSIS — F411 Generalized anxiety disorder: Secondary | ICD-10-CM | POA: Diagnosis not present

## 2023-06-11 DIAGNOSIS — F411 Generalized anxiety disorder: Secondary | ICD-10-CM | POA: Diagnosis not present

## 2023-06-14 ENCOUNTER — Telehealth: Payer: Self-pay | Admitting: Genetic Counselor

## 2023-06-14 NOTE — Telephone Encounter (Signed)
 Informed patient that we will be closing the referral as this is our third attempt.

## 2023-06-26 DIAGNOSIS — F411 Generalized anxiety disorder: Secondary | ICD-10-CM | POA: Diagnosis not present

## 2023-07-25 DIAGNOSIS — N926 Irregular menstruation, unspecified: Secondary | ICD-10-CM | POA: Diagnosis not present

## 2023-07-25 DIAGNOSIS — Z8041 Family history of malignant neoplasm of ovary: Secondary | ICD-10-CM | POA: Diagnosis not present

## 2023-07-25 DIAGNOSIS — Z114 Encounter for screening for human immunodeficiency virus [HIV]: Secondary | ICD-10-CM | POA: Diagnosis not present

## 2023-07-25 DIAGNOSIS — Z118 Encounter for screening for other infectious and parasitic diseases: Secondary | ICD-10-CM | POA: Diagnosis not present

## 2023-07-25 DIAGNOSIS — Z113 Encounter for screening for infections with a predominantly sexual mode of transmission: Secondary | ICD-10-CM | POA: Diagnosis not present

## 2023-07-25 DIAGNOSIS — Z01411 Encounter for gynecological examination (general) (routine) with abnormal findings: Secondary | ICD-10-CM | POA: Diagnosis not present

## 2023-07-25 DIAGNOSIS — Z1331 Encounter for screening for depression: Secondary | ICD-10-CM | POA: Diagnosis not present

## 2023-07-25 DIAGNOSIS — Z124 Encounter for screening for malignant neoplasm of cervix: Secondary | ICD-10-CM | POA: Diagnosis not present

## 2023-07-25 DIAGNOSIS — Z1159 Encounter for screening for other viral diseases: Secondary | ICD-10-CM | POA: Diagnosis not present

## 2023-08-10 DIAGNOSIS — F411 Generalized anxiety disorder: Secondary | ICD-10-CM | POA: Diagnosis not present

## 2023-09-07 DIAGNOSIS — F411 Generalized anxiety disorder: Secondary | ICD-10-CM | POA: Diagnosis not present

## 2023-09-26 DIAGNOSIS — Z803 Family history of malignant neoplasm of breast: Secondary | ICD-10-CM | POA: Diagnosis not present

## 2023-09-26 DIAGNOSIS — Z1509 Genetic susceptibility to other malignant neoplasm: Secondary | ICD-10-CM | POA: Diagnosis not present

## 2023-11-01 DIAGNOSIS — Z8759 Personal history of other complications of pregnancy, childbirth and the puerperium: Secondary | ICD-10-CM | POA: Diagnosis not present

## 2023-11-01 DIAGNOSIS — Z3200 Encounter for pregnancy test, result unknown: Secondary | ICD-10-CM | POA: Diagnosis not present

## 2023-11-05 DIAGNOSIS — Z8759 Personal history of other complications of pregnancy, childbirth and the puerperium: Secondary | ICD-10-CM | POA: Diagnosis not present

## 2023-11-07 DIAGNOSIS — Z3201 Encounter for pregnancy test, result positive: Secondary | ICD-10-CM | POA: Diagnosis not present

## 2023-11-07 DIAGNOSIS — R1084 Generalized abdominal pain: Secondary | ICD-10-CM | POA: Diagnosis not present

## 2023-11-10 ENCOUNTER — Encounter (HOSPITAL_COMMUNITY): Payer: Self-pay | Admitting: Obstetrics and Gynecology

## 2023-11-10 ENCOUNTER — Inpatient Hospital Stay (HOSPITAL_COMMUNITY)

## 2023-11-10 ENCOUNTER — Other Ambulatory Visit: Payer: Self-pay

## 2023-11-10 ENCOUNTER — Inpatient Hospital Stay (HOSPITAL_COMMUNITY)
Admission: AD | Admit: 2023-11-10 | Discharge: 2023-11-10 | Disposition: A | Discharge status: Home / Self Care | Attending: Obstetrics & Gynecology | Admitting: Obstetrics & Gynecology

## 2023-11-10 DIAGNOSIS — O208 Other hemorrhage in early pregnancy: Secondary | ICD-10-CM | POA: Insufficient documentation

## 2023-11-10 DIAGNOSIS — Z3A01 Less than 8 weeks gestation of pregnancy: Secondary | ICD-10-CM

## 2023-11-10 DIAGNOSIS — O26891 Other specified pregnancy related conditions, first trimester: Secondary | ICD-10-CM | POA: Diagnosis not present

## 2023-11-10 DIAGNOSIS — N939 Abnormal uterine and vaginal bleeding, unspecified: Secondary | ICD-10-CM | POA: Diagnosis not present

## 2023-11-10 DIAGNOSIS — Z3A14 14 weeks gestation of pregnancy: Secondary | ICD-10-CM | POA: Insufficient documentation

## 2023-11-10 LAB — POCT PREGNANCY, URINE: Preg Test, Ur: POSITIVE — AB

## 2023-11-10 LAB — PREGNANCY, URINE: Preg Test, Ur: POSITIVE — AB

## 2023-11-10 NOTE — MAU Note (Signed)
 Patient reports having spotting over the past week and visiting OB office, OB office did US  and saw a sac and FHR. Patient normally notices a small amount of blood when she wipes using the restroom and today she had to place a pad on and noticed more than usual but light to moderate bleeding. Patient feels some intermittent cramping. VSS. BP 118/69, Pulse 80, Temp 98.3. Unable to get FHR with doppler at this time.

## 2023-11-10 NOTE — MAU Provider Note (Cosign Needed Addendum)
 [name] is a 33 y.o. G44P1011 pregnant female at [redacted]w[redacted]d who presents to MAU today with complaint of increased bleeding - has been spotting since before she found out she was pregnant, has been told by her providers this is normal. Quants have trended up well, fetal heart rate of 110 was seen on Wednesday. She's been having spotting that has been getting progressively darker in color, today she had a larger bleeding event that went through her underwear and involved one clot. Was concerned so came in. Having very mild occasional period cramps. No other physical complaints.  Receives care at [facility].   Pertinent items noted in HPI and remainder of comprehensive ROS otherwise negative.   O BP 118/69 (BP Location: Right Arm)   Pulse 80   Temp 98.3 F (36.8 C) (Oral)   Resp 18   Wt 153 lb (69.4 kg)   LMP 09/22/2023   BMI 27.98 kg/m   Physical Exam Vitals and nursing note reviewed.  Constitutional:      Appearance: Normal appearance.  HENT:     Head: Normocephalic.  Eyes:     Pupils: Pupils are equal, round, and reactive to light.  Cardiovascular:     Rate and Rhythm: Normal rate and regular rhythm.  Pulmonary:     Effort: Pulmonary effort is normal.  Abdominal:     Palpations: Abdomen is soft.     Tenderness: There is no abdominal tenderness.  Musculoskeletal:        General: Normal range of motion.     Cervical back: Normal range of motion.  Skin:    General: Skin is warm and dry.     Capillary Refill: Capillary refill takes less than 2 seconds.  Neurological:     Mental Status: She is alert and oriented to person, place, and time.  Psychiatric:        Mood and Affect: Mood normal.        Behavior: Behavior normal.    Results for orders placed or performed during the hospital encounter of 11/10/23 (from the past 24 hours)  Pregnancy, urine POC     Status: Abnormal   Collection Time: 11/10/23  6:58 PM  Result Value Ref Range   Preg Test, Ur POSITIVE  (A) NEGATIVE  Pregnancy, urine     Status: Abnormal   Collection Time: 11/10/23  7:05 PM  Result Value Ref Range   Preg Test, Ur POSITIVE (A) NEGATIVE  US  OB LESS THAN 14 WEEKS WITH OB TRANSVAGINAL Result Date: 11/10/2023 CLINICAL DATA:  Initial evaluation for early pregnancy, acute vaginal bleeding. EXAM: OBSTETRIC <14 WK US  AND TRANSVAGINAL OB US  TECHNIQUE: Both transabdominal and transvaginal ultrasound examinations were performed for complete evaluation of the gestation as well as the maternal uterus, adnexal regions, and pelvic cul-de-sac. Transvaginal technique was performed to assess early pregnancy. COMPARISON:  None Available. FINDINGS: Intrauterine gestational sac: Single Yolk sac:  Present Embryo:  Present Cardiac Activity: Present Heart Rate: 130 bpm CRL:  9.5 mm   7 w   0 d                  US  EDC: 06/28/2024 Subchorionic hemorrhage: Small subchorionic hemorrhage measuring up to 1.7 cm without significant mass effect. Maternal uterus/adnexae: Ovaries within normal limits bilaterally. No adnexal mass or free fluid. IMPRESSION: 1. Single viable IUP, estimated gestational age [redacted] weeks and 0 days by crown-rump length, with ultrasound EDC of 06/28/2024. 2. Small subchorionic hemorrhage as above. Electronically Signed  By: Morene Hoard M.D.   On: 11/10/2023 21:08    MDM: Moderate MAU Course: Workup completed from triage due to patient stability and MAU acuity. Sent for U/S to confirm fetal heart tones and assess for sources of bleeding. [facility] noted on U/S, gave results to patient and advised pelvic rest for the next two weeks. Discussed the low connection between small [facility] and miscarriage, reassurance given.  Pt has history of hyperemesis with her last pregnancy, advised starting infusions at the [facility] quickly if HG returns.   A 1. Vaginal bleeding (Primary)  2. Subchorionic hemorrhage of placenta in first trimester - Discharge patient  3. [redacted] weeks gestation of  pregnancy   P Discharge from MAU in stable condition with return precautions Follow up at [facility] as scheduled for ongoing prenatal care  No future appointments. Allergies as of 11/10/2023       Reactions   Banana Anaphylaxis, Shortness Of Breath   Latex Rash, Swelling, Dermatitis   Blisters   Penicillins Hives, Dermatitis   hives   Pineapple Anaphylaxis, Shortness Of Breath   Vancomycin Anaphylaxis   REACTION: red man syndrome per patient   Doxycycline Hyclate    REACTION: nausea , dizzines   Egg-derived Products Dermatitis   Hives        Medication List     STOP taking these medications    ibuprofen  600 MG tablet Commonly known as: ADVIL        TAKE these medications    acetaminophen  500 MG tablet Commonly known as: TYLENOL  Take 500 mg by mouth every 6 (six) hours as needed.   famotidine 20 MG tablet Commonly known as: PEPCID Take 20 mg by mouth 2 (two) times daily.   pantoprazole  40 MG tablet Commonly known as: PROTONIX  Take 40 mg by mouth daily.   PRENATAL 1 PO Take by mouth.       Michele Price, [place] 11/10/2023 10:07 PM

## 2023-11-16 DIAGNOSIS — O209 Hemorrhage in early pregnancy, unspecified: Secondary | ICD-10-CM | POA: Diagnosis not present

## 2023-11-16 DIAGNOSIS — Z3A00 Weeks of gestation of pregnancy not specified: Secondary | ICD-10-CM | POA: Diagnosis not present

## 2023-12-01 ENCOUNTER — Inpatient Hospital Stay (HOSPITAL_COMMUNITY)
Admission: AD | Admit: 2023-12-01 | Discharge: 2023-12-01 | Disposition: A | Discharge status: Home / Self Care | Attending: Family Medicine | Admitting: Family Medicine

## 2023-12-01 ENCOUNTER — Other Ambulatory Visit: Payer: Self-pay

## 2023-12-01 ENCOUNTER — Encounter (HOSPITAL_COMMUNITY): Payer: Self-pay | Admitting: Obstetrics

## 2023-12-01 DIAGNOSIS — Z3A10 10 weeks gestation of pregnancy: Secondary | ICD-10-CM | POA: Diagnosis not present

## 2023-12-01 DIAGNOSIS — R109 Unspecified abdominal pain: Secondary | ICD-10-CM | POA: Diagnosis not present

## 2023-12-01 DIAGNOSIS — O26899 Other specified pregnancy related conditions, unspecified trimester: Secondary | ICD-10-CM

## 2023-12-01 DIAGNOSIS — O26891 Other specified pregnancy related conditions, first trimester: Secondary | ICD-10-CM | POA: Diagnosis not present

## 2023-12-01 DIAGNOSIS — R1030 Lower abdominal pain, unspecified: Secondary | ICD-10-CM | POA: Diagnosis not present

## 2023-12-01 LAB — URINALYSIS, ROUTINE W REFLEX MICROSCOPIC
Bilirubin Urine: NEGATIVE
Glucose, UA: NEGATIVE mg/dL
Ketones, ur: NEGATIVE mg/dL
Leukocytes,Ua: NEGATIVE
Nitrite: NEGATIVE
Protein, ur: NEGATIVE mg/dL
Specific Gravity, Urine: 1.026 (ref 1.005–1.030)
pH: 5 (ref 5.0–8.0)

## 2023-12-01 LAB — WET PREP, GENITAL
Sperm: NONE SEEN
Trich, Wet Prep: NONE SEEN
WBC, Wet Prep HPF POC: <10 (ref <10)
Yeast Wet Prep HPF POC: NONE SEEN

## 2023-12-01 MED ORDER — SIMETHICONE 80 MG PO CHEW
80.0000 mg | CHEWABLE_TABLET | Freq: Once | ORAL | Status: AC
  Administered 2023-12-01: 80 mg via ORAL
  Filled 2023-12-01: qty 1

## 2023-12-01 MED ORDER — METRONIDAZOLE 500 MG PO TABS: 500.0000 mg | ORAL_TABLET | Freq: Two times a day (BID) | ORAL | 0 refills | Status: AC

## 2023-12-01 NOTE — MAU Note (Signed)
 Michele Price is a 33 y.o. at [redacted]w[redacted]d here in MAU reporting: abdominal pain throughout the day but not bad, but at 0130 woke up with sharp/cramping pains that have been severe and radiates to her back. Reports dx of subchorionic hematoma and has intermittent light VB. Having N/V that she believes is associated with the pain - emesis x2  LMP: NA Onset of complaint: 0130 Pain score: 9 Vitals:   12/01/23 0314  BP: 125/73  Pulse: 86  Resp: 19  Temp: 98.3 F (36.8 C)  SpO2: 99%     FHT: NA  Lab orders placed from triage: UA

## 2023-12-01 NOTE — MAU Provider Note (Signed)
 History     CSN: [id number]  Arrival date and time: 12/01/23 0249   Event Date/Time   First Provider Initiated Contact with Patient 12/01/23 [id number]      Chief Complaint  Patient presents with   Abdominal Pain   Nausea   Emesis   Back Pain   Vaginal Bleeding    Michele Price is a 33 y.o. G3P1011 at [redacted]w[redacted]d who receives care at [facility].  She states her next appt is in 2 weeks.  She presents today for abdominal pain.  She states around 0130 she had excruciating sharp cramping and slightly burning abdominal pain.  She states the pain was intermittent and started on her left side and radiated to pelvic area.  She states the pain is still present, but not as excruciating as it was earlier.  She rates the pain a 5/10 currently, but earlier it was a 9/10.    OB History     Gravida  3   Para  1   Term  1   Preterm      AB  1   Living  1      SAB  1   IAB      Ectopic      Multiple  0   Live Births  1           Past Medical History:  Diagnosis Date   Anemia    GERD (gastroesophageal reflux disease)    Hyperprolactinemia (HCC)    Palpitation    Tachycardia     Past Surgical History:  Procedure Laterality Date   NO PAST SURGERIES      Family History  Problem Relation Age of Onset   Hypertension Mother    Hypertension Father    Hypertension Brother    Hypertension Brother    Breast cancer Maternal Aunt    Breast cancer Maternal Grandmother    Breast cancer Cousin     Social History   Tobacco Use   Smoking status: Never   Smokeless tobacco: Never  Vaping Use   Vaping status: Never Used  Substance Use Topics   Alcohol use: Not Currently   Drug use: Never    Allergies:  Allergies  Allergen Reactions   Banana Anaphylaxis and Shortness Of Breath   Latex Rash, Swelling and Dermatitis    Blisters   Penicillins Hives and Dermatitis    hives   Pineapple Anaphylaxis and Shortness Of Breath   Vancomycin Anaphylaxis    REACTION:  red man syndrome per patient   Egg-Derived Products Dermatitis    Hives    Medications Prior to Admission  Medication Sig Dispense Refill Last Dose/Taking   famotidine (PEPCID) 20 MG tablet Take 20 mg by mouth 2 (two) times daily.   Past Month   pantoprazole  (PROTONIX ) 40 MG tablet Take 40 mg by mouth daily.   11/30/2023   Prenatal MV-Min-Fe Fum-FA-DHA (PRENATAL 1 PO) Take by mouth.   11/30/2023   acetaminophen  (TYLENOL ) 500 MG tablet Take 500 mg by mouth every 6 (six) hours as needed.   More than a month    Review of Systems  Gastrointestinal:  Positive for abdominal pain and constipation. Negative for diarrhea, nausea and vomiting.  Genitourinary:  Positive for vaginal bleeding (Known SCH-pinkish red). Negative for difficulty urinating, dysuria and vaginal discharge.   Physical Exam   Blood pressure 125/73, pulse 86, temperature 98.3 F (36.8 C), temperature source Oral, resp. rate 19, height 5' 2 (1.575 m), weight  66.9 kg, last menstrual period 09/22/2023, SpO2 99%, unknown if currently breastfeeding.  Physical Exam Vitals reviewed.  Constitutional:      Appearance: She is well-developed.  HENT:     Head: Normocephalic and atraumatic.  Cardiovascular:     Rate and Rhythm: Normal rate.  Pulmonary:     Effort: Pulmonary effort is normal. No respiratory distress.  Abdominal:     Palpations: Abdomen is soft.  Skin:    General: Skin is warm and dry.  Neurological:     Mental Status: She is alert.     MAU Course  Procedures Results for orders placed or performed during the hospital encounter of 12/01/23 (from the past 24 hours)  Urinalysis, Routine w reflex microscopic -Urine, Clean Catch     Status: Abnormal   Collection Time: 12/01/23  3:31 AM  Result Value Ref Range   Color, Urine AMBER (A) YELLOW   APPearance HAZY (A) CLEAR   Specific Gravity, Urine 1.026 1.005 - 1.030   pH 5.0 5.0 - 8.0   Glucose, UA NEGATIVE NEGATIVE mg/dL   Hgb urine dipstick MODERATE (A)  NEGATIVE   Bilirubin Urine NEGATIVE NEGATIVE   Ketones, ur NEGATIVE NEGATIVE mg/dL   Protein, ur NEGATIVE NEGATIVE mg/dL   Nitrite NEGATIVE NEGATIVE   Leukocytes,Ua NEGATIVE NEGATIVE   RBC / HPF 11-20 0 - 5 RBC/hpf   WBC, UA 0-5 0 - 5 WBC/hpf   Bacteria, UA RARE (A) NONE SEEN   Squamous Epithelial / HPF 0-5 0 - 5 /HPF   Mucus PRESENT    Ca Oxalate Crys, UA PRESENT   Wet prep, genital     Status: Abnormal   Collection Time: 12/01/23  4:57 AM   Specimen: Vaginal  Result Value Ref Range   Yeast Wet Prep HPF POC NONE SEEN NONE SEEN   Trich, Wet Prep NONE SEEN NONE SEEN   Clue Cells Wet Prep HPF POC PRESENT (A) NONE SEEN   WBC, Wet Prep HPF POC <10 <10   Sperm NONE SEEN     Patient informed that the ultrasound is considered a limited OB ultrasound and is not intended to be a complete ultrasound exam.  Patient also informed that the ultrasound is not being completed with the intent of assessing for fetal or placental anomalies or any pelvic abnormalities.  Explained that the purpose of today's ultrasound is to assess for  reassurance and viability.  Patient acknowledges the purpose of the exam and the limitations of the study.         MDM Cultures: Wet Prep Labs: UA Antiflatulence BSUS Prescription Assessment and Plan  33 year old, G3P1011  SIUP at 10 weeks Abdominal Pain  -Reviewed POC with patient. -Exam performed and findings discussed.  -Patient offered and declines pain medication. -Discussed how vaginal infections could contribute to abdominal discomfort and recommend wet prep. Patient agreeable. -Discussed usage of simethicone  for reported bloating. Patient agreeable. -Nurse to collect wet prep. Once results return will return to review findings and perform BSUS.    Michele Price 12/01/2023, 4:36 AM   Reassessment (5:39 AM) -Wet prep returns suggestive of BV. Discussed diagnosis, causes, prevention, and treatment. Patient opts for oral medication. -Rx sent to  pharmacy on file.  -BSUS performed revealing SIUP with good movement. FHR 146. YS noted. CRL c/w [redacted] wk gestation. -Patient without further questions. -Precautions reviewed. -Encouraged to call primary office or return to MAU if symptoms worsen or with the onset of new symptoms. -Discharged to home in  stable condition.  Michele LITTIE Duncans MSN, CNM Advanced Practice Provider, [facility] [employer]

## 2023-12-05 DIAGNOSIS — F411 Generalized anxiety disorder: Secondary | ICD-10-CM | POA: Diagnosis not present

## 2023-12-06 DIAGNOSIS — Z3689 Encounter for other specified antenatal screening: Secondary | ICD-10-CM | POA: Diagnosis not present

## 2023-12-06 DIAGNOSIS — Z118 Encounter for screening for other infectious and parasitic diseases: Secondary | ICD-10-CM | POA: Diagnosis not present

## 2023-12-06 DIAGNOSIS — Z3A10 10 weeks gestation of pregnancy: Secondary | ICD-10-CM | POA: Diagnosis not present

## 2023-12-06 DIAGNOSIS — Z1331 Encounter for screening for depression: Secondary | ICD-10-CM | POA: Diagnosis not present

## 2023-12-06 DIAGNOSIS — O3680X0 Pregnancy with inconclusive fetal viability, not applicable or unspecified: Secondary | ICD-10-CM | POA: Diagnosis not present

## 2023-12-06 DIAGNOSIS — Z3481 Encounter for supervision of other normal pregnancy, first trimester: Secondary | ICD-10-CM | POA: Diagnosis not present

## 2023-12-19 DIAGNOSIS — F411 Generalized anxiety disorder: Secondary | ICD-10-CM | POA: Diagnosis not present

## 2024-01-16 DIAGNOSIS — F411 Generalized anxiety disorder: Secondary | ICD-10-CM | POA: Diagnosis not present

## 2024-02-06 DIAGNOSIS — Z3A19 19 weeks gestation of pregnancy: Secondary | ICD-10-CM | POA: Diagnosis not present

## 2024-02-06 DIAGNOSIS — Z361 Encounter for antenatal screening for raised alphafetoprotein level: Secondary | ICD-10-CM | POA: Diagnosis not present

## 2024-02-06 DIAGNOSIS — N393 Stress incontinence (female) (male): Secondary | ICD-10-CM | POA: Diagnosis not present

## 2024-02-06 DIAGNOSIS — O3503X0 Maternal care for (suspected) central nervous system malformation or damage in fetus, choroid plexus cysts, not applicable or unspecified: Secondary | ICD-10-CM | POA: Diagnosis not present

## 2024-02-13 DIAGNOSIS — F411 Generalized anxiety disorder: Secondary | ICD-10-CM | POA: Diagnosis not present

## 2024-03-07 DIAGNOSIS — F411 Generalized anxiety disorder: Secondary | ICD-10-CM | POA: Diagnosis not present

## 2024-03-11 DIAGNOSIS — Z3482 Encounter for supervision of other normal pregnancy, second trimester: Secondary | ICD-10-CM | POA: Diagnosis not present

## 2024-03-11 DIAGNOSIS — N760 Acute vaginitis: Secondary | ICD-10-CM | POA: Diagnosis not present

## 2024-04-23 ENCOUNTER — Ambulatory Visit: Admitting: Physical Therapy

## 2024-06-28 ENCOUNTER — Inpatient Hospital Stay (HOSPITAL_COMMUNITY): Admit: 2024-06-28
